# Patient Record
Sex: Female | Born: 1935 | Race: White | Hispanic: No | Smoking: Never smoker
Health system: Southern US, Community
[De-identification: ages and names within clinical notes are randomized; demographics above are authoritative.]

## PROBLEM LIST (undated history)

## (undated) DIAGNOSIS — M4800 Spinal stenosis, site unspecified: Secondary | ICD-10-CM

## (undated) DIAGNOSIS — J45909 Unspecified asthma, uncomplicated: Secondary | ICD-10-CM

## (undated) DIAGNOSIS — I10 Essential (primary) hypertension: Secondary | ICD-10-CM

## (undated) HISTORY — PX: PARTIAL HYSTERECTOMY: SHX80

## (undated) HISTORY — PX: BACK SURGERY: SHX140

## (undated) HISTORY — DX: Unspecified asthma, uncomplicated: J45.909

---

## 1998-06-05 ENCOUNTER — Other Ambulatory Visit: Admission: RE | Admit: 1998-06-05 | Discharge: 1998-06-05 | Payer: Self-pay | Admitting: Family Medicine

## 2002-06-15 ENCOUNTER — Ambulatory Visit (HOSPITAL_COMMUNITY): Admission: RE | Admit: 2002-06-15 | Discharge: 2002-06-15 | Payer: Self-pay | Admitting: Gastroenterology

## 2003-02-17 ENCOUNTER — Encounter: Payer: Self-pay | Admitting: Neurosurgery

## 2003-02-17 ENCOUNTER — Encounter: Admission: RE | Admit: 2003-02-17 | Discharge: 2003-02-17 | Payer: Self-pay | Admitting: Neurosurgery

## 2003-02-17 ENCOUNTER — Encounter: Payer: Self-pay | Admitting: Radiology

## 2003-03-03 ENCOUNTER — Encounter: Payer: Self-pay | Admitting: Neurosurgery

## 2003-03-03 ENCOUNTER — Encounter: Admission: RE | Admit: 2003-03-03 | Discharge: 2003-03-03 | Payer: Self-pay | Admitting: Neurosurgery

## 2003-03-11 ENCOUNTER — Emergency Department (HOSPITAL_COMMUNITY): Admission: RE | Admit: 2003-03-11 | Discharge: 2003-03-12 | Payer: Self-pay | Admitting: Emergency Medicine

## 2003-03-11 ENCOUNTER — Encounter: Payer: Self-pay | Admitting: Emergency Medicine

## 2004-05-10 ENCOUNTER — Other Ambulatory Visit: Admission: RE | Admit: 2004-05-10 | Discharge: 2004-05-10 | Payer: Self-pay | Admitting: Family Medicine

## 2010-11-05 ENCOUNTER — Emergency Department (HOSPITAL_COMMUNITY): Payer: Medicare Other

## 2010-11-05 ENCOUNTER — Inpatient Hospital Stay (HOSPITAL_COMMUNITY)
Admission: EM | Admit: 2010-11-05 | Discharge: 2010-11-11 | DRG: 418 | Disposition: A | Discharge status: Home / Self Care | Payer: Medicare Other | Attending: Surgery | Admitting: Surgery

## 2010-11-05 DIAGNOSIS — E876 Hypokalemia: Secondary | ICD-10-CM | POA: Diagnosis not present

## 2010-11-05 DIAGNOSIS — K8000 Calculus of gallbladder with acute cholecystitis without obstruction: Principal | ICD-10-CM | POA: Diagnosis present

## 2010-11-05 DIAGNOSIS — G8929 Other chronic pain: Secondary | ICD-10-CM | POA: Diagnosis present

## 2010-11-05 DIAGNOSIS — K7689 Other specified diseases of liver: Secondary | ICD-10-CM | POA: Diagnosis present

## 2010-11-05 DIAGNOSIS — M5137 Other intervertebral disc degeneration, lumbosacral region: Secondary | ICD-10-CM | POA: Diagnosis present

## 2010-11-05 DIAGNOSIS — B961 Klebsiella pneumoniae [K. pneumoniae] as the cause of diseases classified elsewhere: Secondary | ICD-10-CM | POA: Diagnosis present

## 2010-11-05 DIAGNOSIS — M51379 Other intervertebral disc degeneration, lumbosacral region without mention of lumbar back pain or lower extremity pain: Secondary | ICD-10-CM | POA: Diagnosis present

## 2010-11-05 DIAGNOSIS — H409 Unspecified glaucoma: Secondary | ICD-10-CM | POA: Diagnosis present

## 2010-11-05 DIAGNOSIS — I10 Essential (primary) hypertension: Secondary | ICD-10-CM | POA: Diagnosis present

## 2010-11-05 DIAGNOSIS — N390 Urinary tract infection, site not specified: Secondary | ICD-10-CM | POA: Diagnosis present

## 2010-11-05 LAB — URINALYSIS, ROUTINE W REFLEX MICROSCOPIC
Bilirubin Urine: NEGATIVE
Glucose, UA: NEGATIVE mg/dL
Ketones, ur: 15 mg/dL — AB
Protein, ur: NEGATIVE mg/dL

## 2010-11-05 LAB — CBC
HCT: 41.1 % (ref 36.0–46.0)
MCV: 86.9 fL (ref 78.0–100.0)
RBC: 4.73 MIL/uL (ref 3.87–5.11)
WBC: 13.6 10*3/uL — ABNORMAL HIGH (ref 4.0–10.5)

## 2010-11-05 LAB — COMPREHENSIVE METABOLIC PANEL
ALT: 57 U/L — ABNORMAL HIGH (ref 0–35)
Albumin: 2.6 g/dL — ABNORMAL LOW (ref 3.5–5.2)
Alkaline Phosphatase: 150 U/L — ABNORMAL HIGH (ref 39–117)
BUN: 18 mg/dL (ref 6–23)
Chloride: 96 mEq/L (ref 96–112)
Glucose, Bld: 82 mg/dL (ref 70–99)
Potassium: 3.2 mEq/L — ABNORMAL LOW (ref 3.5–5.1)
Sodium: 132 mEq/L — ABNORMAL LOW (ref 135–145)
Total Bilirubin: 2.5 mg/dL — ABNORMAL HIGH (ref 0.3–1.2)

## 2010-11-05 LAB — DIFFERENTIAL
Basophils Absolute: 0 10*3/uL (ref 0.0–0.1)
Eosinophils Absolute: 0 10*3/uL (ref 0.0–0.7)
Eosinophils Relative: 0 % (ref 0–5)
Lymphs Abs: 1.1 10*3/uL (ref 0.7–4.0)
Monocytes Absolute: 1.2 10*3/uL — ABNORMAL HIGH (ref 0.1–1.0)
Neutrophils Relative %: 83 % — ABNORMAL HIGH (ref 43–77)

## 2010-11-05 LAB — URINE MICROSCOPIC-ADD ON

## 2010-11-05 LAB — POCT CARDIAC MARKERS: Myoglobin, poc: 109 ng/mL (ref 12–200)

## 2010-11-05 LAB — LIPASE, BLOOD: Lipase: 23 U/L (ref 11–59)

## 2010-11-05 MED ORDER — IOHEXOL 300 MG/ML  SOLN
80.0000 mL | Freq: Once | INTRAMUSCULAR | Status: AC | PRN
  Administered 2010-11-05: 80 mL via INTRAVENOUS

## 2010-11-06 ENCOUNTER — Inpatient Hospital Stay (HOSPITAL_COMMUNITY): Payer: Medicare Other

## 2010-11-06 LAB — COMPREHENSIVE METABOLIC PANEL
ALT: 40 U/L — ABNORMAL HIGH (ref 0–35)
AST: 35 U/L (ref 0–37)
Alkaline Phosphatase: 148 U/L — ABNORMAL HIGH (ref 39–117)
CO2: 24 mEq/L (ref 19–32)
Calcium: 8.5 mg/dL (ref 8.4–10.5)
Chloride: 104 mEq/L (ref 96–112)
GFR calc Af Amer: >60 mL/min (ref >60)
GFR calc non Af Amer: >60 mL/min (ref >60)
Glucose, Bld: 76 mg/dL (ref 70–99)
Potassium: 3 mEq/L — ABNORMAL LOW (ref 3.5–5.1)
Sodium: 136 mEq/L (ref 135–145)
Total Bilirubin: 2.3 mg/dL — ABNORMAL HIGH (ref 0.3–1.2)

## 2010-11-06 LAB — CBC
HCT: 35.6 % — ABNORMAL LOW (ref 36.0–46.0)
Hemoglobin: 12.3 g/dL (ref 12.0–15.0)
MCHC: 34.6 g/dL (ref 30.0–36.0)
RBC: 4.09 MIL/uL (ref 3.87–5.11)

## 2010-11-06 MED ORDER — GADOBENATE DIMEGLUMINE 529 MG/ML IV SOLN
10.0000 mL | Freq: Once | INTRAVENOUS | Status: AC
  Administered 2010-11-06: 10 mL via INTRAVENOUS

## 2010-11-07 ENCOUNTER — Inpatient Hospital Stay (HOSPITAL_COMMUNITY): Payer: Medicare Other

## 2010-11-07 LAB — COMPREHENSIVE METABOLIC PANEL
ALT: 28 U/L (ref 0–35)
AST: 37 U/L (ref 0–37)
Albumin: 2.3 g/dL — ABNORMAL LOW (ref 3.5–5.2)
CO2: 25 mEq/L (ref 19–32)
Chloride: 104 mEq/L (ref 96–112)
Creatinine, Ser: 0.72 mg/dL (ref 0.4–1.2)
GFR calc Af Amer: >60 mL/min (ref >60)
GFR calc non Af Amer: >60 mL/min (ref >60)
Potassium: 2.9 mEq/L — ABNORMAL LOW (ref 3.5–5.1)
Sodium: 135 mEq/L (ref 135–145)
Total Bilirubin: 2.3 mg/dL — ABNORMAL HIGH (ref 0.3–1.2)

## 2010-11-07 LAB — URINE CULTURE

## 2010-11-07 LAB — CBC
HCT: 33.6 % — ABNORMAL LOW (ref 36.0–46.0)
Hemoglobin: 11.6 g/dL — ABNORMAL LOW (ref 12.0–15.0)
MCH: 30.4 pg (ref 26.0–34.0)
MCHC: 34.5 g/dL (ref 30.0–36.0)
RDW: 13.7 % (ref 11.5–15.5)

## 2010-11-08 LAB — CBC
MCH: 29.8 pg (ref 26.0–34.0)
MCV: 89.5 fL (ref 78.0–100.0)
Platelets: 221 10*3/uL (ref 150–400)
RBC: 4.1 MIL/uL (ref 3.87–5.11)
RDW: 13.7 % (ref 11.5–15.5)
WBC: 9.2 10*3/uL (ref 4.0–10.5)

## 2010-11-08 LAB — COMPREHENSIVE METABOLIC PANEL
AST: 51 U/L — ABNORMAL HIGH (ref 0–37)
Albumin: 2.7 g/dL — ABNORMAL LOW (ref 3.5–5.2)
BUN: 5 mg/dL — ABNORMAL LOW (ref 6–23)
Creatinine, Ser: 0.76 mg/dL (ref 0.4–1.2)
GFR calc Af Amer: >60 mL/min (ref >60)
Total Protein: 6 g/dL (ref 6.0–8.3)

## 2010-11-08 LAB — LIPASE, BLOOD: Lipase: 26 U/L (ref 11–59)

## 2010-11-08 LAB — CLOSTRIDIUM DIFFICILE BY PCR: Toxigenic C. Difficile by PCR: NEGATIVE

## 2010-11-09 ENCOUNTER — Inpatient Hospital Stay (HOSPITAL_COMMUNITY): Payer: Medicare Other

## 2010-11-09 ENCOUNTER — Other Ambulatory Visit: Payer: Self-pay | Admitting: General Surgery

## 2010-11-09 LAB — CBC
HCT: 34.8 % — ABNORMAL LOW (ref 36.0–46.0)
Hemoglobin: 11.4 g/dL — ABNORMAL LOW (ref 12.0–15.0)
RBC: 3.88 MIL/uL (ref 3.87–5.11)
WBC: 8.5 10*3/uL (ref 4.0–10.5)

## 2010-11-09 LAB — COMPREHENSIVE METABOLIC PANEL
ALT: 30 U/L (ref 0–35)
AST: 34 U/L (ref 0–37)
Albumin: 2.3 g/dL — ABNORMAL LOW (ref 3.5–5.2)
Alkaline Phosphatase: 177 U/L — ABNORMAL HIGH (ref 39–117)
Chloride: 108 mEq/L (ref 96–112)
Creatinine, Ser: 0.69 mg/dL (ref 0.4–1.2)
GFR calc Af Amer: >60 mL/min (ref >60)
Potassium: 3.8 mEq/L (ref 3.5–5.1)
Sodium: 136 mEq/L (ref 135–145)
Total Bilirubin: 1.3 mg/dL — ABNORMAL HIGH (ref 0.3–1.2)

## 2010-11-09 LAB — AMYLASE: Amylase: 28 U/L (ref 0–105)

## 2010-11-14 NOTE — H&P (Signed)
Janet Brooks, Janet Brooks                ACCOUNT NO.:  [id number]  MEDICAL RECORD NO.:  [id number]           PATIENT TYPE:  I  LOCATION:  5125                         FACILITY:  [facility]  PHYSICIAN:  Abigail Miyamoto, M.D. DATE OF BIRTH:  1921/05/07  DATE OF ADMISSION:  11/05/2010 DATE OF DISCHARGE:                             HISTORY & PHYSICAL   CHIEF COMPLAINT:  Nausea, vomiting, and abdominal pain.  HISTORY:  This is an 77 year old female, presents with 3-day history of nausea, vomiting, and abdominal pain.  She reports the pain has been in her epigastrium, radiating up to her right upper quadrant through to her back.  She has had nausea and vomiting with this.  The pain has been moderate to severe.  She has had no similar discomfort.  She denies hematemesis.  She also denies fever or chills.  PAST MEDICAL HISTORY:  Spinal stenosis where she has chronic pain.  She also has hypertension.  PAST SURGICAL HISTORY:  Not obtained.  I will review this with the patient.  MEDICATIONS:  Please see the universal medical reconciliation form.  FAMILY HISTORY:  Noncontributory.  SOCIAL HISTORY:  She does not smoke.  Does not drink alcohol.  REVIEW OF SYSTEMS:  GENERAL:  Negative for fever or chills.  PULMONARY: Negative for cough, shortness of breath, difficulty breathing.  CARDIAC: Negative for chest pain or irregular heartbeat.  Positive for hypertension.  ABDOMEN:  Listed as above.  She has had no previous abdominal pain.  She has had normal bowel movements.  No hematemesis. Urinary is negative for dysuria or hematuria.  Rest of review of systems is unremarkable from a eyes, skin, ears, nose, and throat, musculoskeletal, neurologic, psychiatric, endocrine standpoint, except for spinal stenosis and chronic back pain.  ALLERGIES:  No known drug allergies.  PHYSICAL EXAMINATION:  GENERAL:  This is a thin, elderly female, who is comfortable, and actually appears slightly younger than  stated age. VITAL SIGNS:  Temperature 98.7, heart rate 73, blood pressure is 119/67, respiratory rate is 20. EYES:  Anicteric.  Pupils are reactive bilaterally. ENT:  External ears and nose are normal.  Hearing is normal.  Oropharynx is clear. NECK:  Supple.  Trachea is midline.  There is no thyromegaly. LUNGS:  Clear to auscultation bilaterally with normal respiratory effort. CARDIOVASCULAR:  Regular rate and rhythm.  There are no murmurs.  There is no peripheral edema. ABDOMEN:  Soft.  There is very minimal tenderness in the epigastrium and right upper quadrant with no guarding.  Bowel sounds are positive. There are no hernias.  There is no organomegaly.  There are no masses. EXTREMITIES:  Warm, well perfused.  No edema, cyanosis.  Peripheral pulses are intact in all four extremities. SKIN:  Shows mild jaundice.  There are no rashes. NEUROLOGIC:  Shows gross motor and sensory function in all four extremities. PSYCHIATRIC:  Shows normal affect.  She is awake, alert, and oriented. Judgment appears normal as well.  LABORATORY DATA:  The patient has a bilirubin elevated 2.5, alkaline phosphatase 150, AST 48, ALT 57, lipase is normal at 23.  CBC is elevated with a white blood  count of 13.6 with a left shift, hemoglobin is 14.7, platelets are 164.  The patient has a CAT scan of the abdomen and pelvis this shows choledocholithiasis and stones in her gallbladder. The bile duct is dilated to 1 cm.  There are multiple cyst in the liver with the large being 4.9 cm.  There is also a lesion measuring almost 2 cm in the medial segment of left hepatic lobe of uncertain etiology. The gallbladder wall is not thickened.  There is no pericholecystic fluid.  IMPRESSION:  This patient with cholecystitis, choledocholithiasis, liver cysts, and an indeterminate liver mass.  At this point, she was admitted to the surgical service.  I have consulted Dr. Jeani Hawking as Dr. Charna Elizabeth has done a  colonoscopy initially back in 2003.  He will consider ERCP.  I will also order an MRI of her liver to evaluate this liver lesion, eventually the laparoscopic cholecystectomy as well.     Abigail Miyamoto, M.D.     DB/MEDQ  D:  11/05/2010  T:  11/06/2010  Job:  [id number]  Electronically Signed by Abigail Miyamoto M.D. on 11/14/2010 10:59:12 AM

## 2010-11-17 NOTE — Consult Note (Signed)
Janet Brooks, Janet Brooks                ACCOUNT NO.:  [id number]  MEDICAL RECORD NO.:  [id number]           PATIENT TYPE:  I  LOCATION:  5125                         FACILITY:  [facility]  PHYSICIAN:  Jordan Hawks. Elnoria Howard, MD    DATE OF BIRTH:  August 03, 1920  DATE OF CONSULTATION:  11/06/2010 DATE OF DISCHARGE:                                CONSULTATION   REASON FOR CONSULTATION:  Abdominal pain, nausea, and vomiting.  REFERRING PHYSICIAN:  Abigail Miyamoto, MD  HISTORY OF PRESENT ILLNESS:  This is an 77 year old female with past medical history of spinal stenosis and hypertension, who was admitted to the hospital with complaints of nausea, vomiting, and abdominal pain for the past 3 days.  The patient states that her pain started rather acutely and it was located in the epigastrium and as a result of her symptoms which were persistent, she presented to the emergency room for further evaluation and treatment.  No complaints of any jaundice or fevers.  Further workup in the emergency room revealed that she had choledocholithiasis as well as cholelithiasis.  No evidence of cholecystitis.  There is also findings of some cysts within the liver. Her transaminases were noted to be elevated and consistent with an obstructive etiology.  Subsequently, GI consultation was requested for further evaluation and treatment.  PAST MEDICAL HISTORY AND SURGICAL HISTORY:  As stated above.  FAMILY HISTORY:  Noncontributory.  SOCIAL HISTORY:  Negative for alcohol, tobacco, or illicit drug use.  REVIEW OF SYSTEMS:  As stated above in the history present illness, otherwise negative.  ALLERGIES:  No known drug allergies.  MEDICATIONS: 1. Amlodipine 5 mg p.o. daily. 2. Hydrochlorothiazide 25 mg p.o. daily. 3. Timoptic 1 drop per eye q.12 hours. 4. Morphine one 30 mg IV p.r.n. 5. Protonix 40 mg p.o. daily p.r.n. 6. Ambien 5 mg p.o. at bedtime p.r.n.  ALLERGIES:  No known drug allergies.  PHYSICAL  EXAMINATION:  VITAL SIGNS:  Blood pressure is 130/58, heart rate is 59, respirations 18, temperature is 97.5. GENERAL:  The patient is in no acute distress, alert and oriented. HEENT:  Normocephalic, atraumatic.  Extraocular muscles intact. NECK:  Supple.  No lymphadenopathy. LUNGS:  Clear to auscultation bilaterally. CARDIOVASCULAR:  Regular rate and rhythm. ABDOMEN:  Flat, soft, mild tenderness in the epigastrium.  No rebound, rigidity.  Positive bowel sounds. EXTREMITIES:  No clubbing, cyanosis, or edema.  LABORATORY VALUES:  White blood cell count 6.9, hemoglobin 11.6, MCV is 88.0, platelets at 183.  Sodium 135, potassium 3.9, chloride is 104, CO2 25, glucose 86, BUN 10, creatinine 0.7.  Total bilirubin is 2.3, alk phos 140, AST 37, ALT 28, albumin is 2.3.  IMPRESSION: 1. Choledocholithiasis. 2. Cholelithiasis. 3. Hepatic cyst.  At this time, further evaluation and treatment with an ERCP is required. She is in stable condition and no evidence of an ascending cholangitis given the findings of the CT scan does reveal some hepatic cyst of unclear etiology, an MRI scan will be ordered for that issue to further evaluate.  PLAN: 1. At this time is to treat the patient with antibiotics. 2. Schedule for ERCP on  November 07, 2010.     Jordan Hawks Elnoria Howard, MD     PDH/MEDQ  D:  11/07/2010  T:  11/08/2010  Job:  [id number]  cc:   Abigail Miyamoto, M.D.  Electronically Signed by Jeani Hawking MD on 11/17/2010 08:54:10 PM

## 2010-11-18 NOTE — Discharge Summary (Signed)
Janet Brooks, Janet Brooks                ACCOUNT NO.:  [id number]  MEDICAL RECORD NO.:  [id number]           PATIENT TYPE:  I  LOCATION:  5125                         FACILITY:  [facility]  PHYSICIAN:  Adolph Pollack, M.D.DATE OF BIRTH:  [birth year]  DATE OF ADMISSION:  11/05/2010 DATE OF DISCHARGE:  11/11/2010                              DISCHARGE SUMMARY   ADMISSION DIAGNOSES: 1. Cholecystitis, choledocholithiasis, liver cyst and indeterminate     liver mass. 2. Hypertension. 3. Chronic pain secondary to spinal stenosis.  DISCHARGE DIAGNOSES: 1. Acute cholecystitis with multiple gallstones. 2. A 2.3 cm left hepatic lobe liver mass, questionable cyst versus     cholangiocarcinoma. 3. ERCP with 4 stones and sphincterotomy from the common duct, November 07, 2010, Dr. Elnoria Howard. 4. Urinary tract infection greater than 100,000 Klebsiella sensitive     to Cipro. 5. Hypertension. 6. Spinal stenosis. 7. Glaucoma.  PROCEDURES: 1. CT scan of the abdomen November 05, 2010, showing common bile duct     dilatation with multiple filling defects in the common bile     worrisome for gallstone, indeterminate hepatic liver mass. 2. Liver and kidney cyst. 3. 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 4. MRI of the liver showing cholelithiasis, choledocholithiasis,     intrahepatic and extra-ductal dilatation of the largest common duct     calculus was 9 mm.  There is a 2.3 cm heterogeneous lesion in the     medial left hepatic lobe with possible delayed enhancement while     lesion may reflect altered  perfusion.  There was also question of     intrahepatic cholangiocarcinoma.  Followup MRI suggestion in 3     months.  If ERCP was performed, they recommended brushings. 5. ERCP with sphincterotomy, Dr. Elnoria Howard, November 07, 2010. 6. Laparoscopic cholecystectomy with intraoperative cholangiogram,     November 09, 2010.  BRIEF HISTORY:  The patient is an 77 year old female who presents with 3- day  history of nausea, vomiting, abdominal pain.  She reported pain in the epigastric radiating to the right quadrant.  She has had nausea and vomiting with this.  Pain has been moderate to severe with she had no similar discomfort prior.  She denies hemoptysis or chills.  She has a history of spinal stenosis and chronic pain.  She was seen in consultation by Dr. Magnus Ivan.  Her bilirubin was elevated at 2.5, alk phos was 150, SGOT was 48, SGPT was 57.  Lipase was normal.  White count was elevated at 13.6.  The bile duct was dilated to 1 cm.  There are multiple cysts in the liver with a large just being 4.9 cm  with a 2-cm mass in the medial segment of the hepatic lobe.  At that point, Dr. Magnus Ivan admitted the patient with plans for evaluation by Dr. Elnoria Howard, GI. She has previously had an endoscopy or colonoscopy by Dr. Charna Elizabeth. There was a question of urinary tract infection.  UA cultures were obtained.  With further workup and treatment as indicated.  For further history and physical, please see the dictated note.  PROCEDURES:1. ERCP  November 07, 2010, Dr. Elnoria Howard. 2. Laparoscopic cholecystectomy, intraoperative arteriogram, November 09, 2010, Dr. Andrey Campanile.  HOSPITAL COURSE:  The patient was admitted, placed on IV antibiotics. She had some ongoing nausea, abdominal pain and diarrhea.  Urine culture was obtained, which turned out to be positive for Klebsiella, and she was switched to Cipro.  She has been treated with Cipro but going through the record, it looks like this has been rather inconsistent.  Her total bilirubin went to 2.3 the next day.  She was seen by Dr. Elnoria Howard as scheduled for ERCP and this was performed on November 07, 2010, which showed successful removal of stones in common bile duct along with sphincterotomy.  She showed good improvement on November 08, 2010.  We tentatively plan to take her to the OR.  Her bilirubin was down, but she was fairly tired and exhausted having a good deal  of diarrhea.  We checked a C diff on her, continue her medical management, gave her a day of rest.  She was having good deal of epigastric burning.  She had been placed on Protonix.  She has been on PPI prior to admission also most likely for GERD-type symptoms.  She also complained of some nasal congestion and was started on  Saline nasal spray.  Following day, she was feeling much better.  LFTs had improved further and was Dr. Tawana Scale opinion, she could be taken to the OR.  At that time, she underwent laparoscopic cholecystectomy with intraoperative cholangiogram.  There were no stones noted.  Following day November 10, 2010, she was slightly hypotensive.  Her blood pressure had been in the 110s to 140s.  She was 95.  Urine output was 550.  Overall, she was doing well.  We worked on advancing her diet, & mobilizing her.  Today, she is afebrile.  Her wounds look good.  She has had no further diarrhea.  She is having flatus.  She ate a full breakfast including cheesy eggs and pancakes without difficulty.  Overall, she feels much better and is anxious for discharge.  We will plan to discharge her home today.  Reviewing the records it looks like she has missed at least a couple doses of IV Cipro.  I am going to give her 4 more days of oral Cipro to cover her urinary tract infection.  We will give her oxycodone IR for pain control.  She can resume her amlodipine 5 mg daily, hydrochlorothiazide 25 mg daily, Prilosec 20 mg daily, timolol ophthalmic drops and Atenolol/acetaminophen 2 tablets by mouth t.i.d. p.r.n.  I am going to have her return to follow up with Dr. Abbey Chatters.  He discussed her MR findings and her liver findings.  So, he knows her well.  I recommend she follow up with Dr. Elnoria Howard and primary care [facility].  CONDITION ON DISCHARGE:  Improved.     Eber Hong, P.A.   ______________________________ Adolph Pollack, M.D.    WDJ/MEDQ  D:  11/11/2010   T:  11/11/2010  Job:  [id number]  cc:   Jordan Hawks. Elnoria Howard, MD [facility]  Electronically Signed by Sherrie George P.A. on 11/18/2010 12:47:12 PM Electronically Signed by Avel Peace M.D. on 11/18/2010 02:26:58 PM

## 2010-11-19 NOTE — Op Note (Signed)
NAMETENNYSON, KALLEN                ACCOUNT NO.:  [id number]  MEDICAL RECORD NO.:  [id number]           PATIENT TYPE:  I  LOCATION:  5125                         FACILITY:  [facility]  PHYSICIAN:  Mary Sella. Andrey Campanile, MD     DATE OF BIRTH:  11-20-1920  DATE OF PROCEDURE:  11/09/2010 DATE OF DISCHARGE:                              OPERATIVE REPORT   PREOPERATIVE DIAGNOSIS:  Symptomatic cholelithiasis.  POSTOPERATIVE DIAGNOSIS:  Acute cholecystitis.  PROCEDURE:  Laparoscopic cholecystectomy with intraoperative cholangiogram.  SURGEON:  Mary Sella. Andrey Campanile, MD  ASSISTANT SURGEON:  Dr. Magnus Ivan, MD  ANESTHESIA:  General with local consisting 0.25% Marcaine with epi.  SPECIMEN:  Gallbladder which was sent to Pathology.  ESTIMATED BLOOD LOSS:  Minimal.  FINDINGS:  The patient had some thin filmy adhesions between the surface of the liver and the anterior abdominal wall.  She also had what appeared to be a cystic lesion in the peripheral left lobe in segment two and in fact another potential cyst just to the right of the falciform ligament. Cholangiogram demonstrated no filling defects into the duodenum.  She did have some interesting anatomy with respect to her gallbladder and bile duct.  There was a cystic duct which entered the neck of the gallbladder laterally which is only ductal structure entering the gallbladder.  She had a very diminutive cystic artery that went into the node of Calot.  The right hepatic artery actually curved up and then backed down in an arch configuration.  The cystic duct appeared to enter either the common bile duct or came off the right hepatic duct.  However, we were certainly in the cystic duct but this is the only ductal structure going into the gallbladder.  No other structure entered the gallbladder.  INDICATIONS FOR PROCEDURE:  The patient is an 77 year old lady who came into the ER with abdominal pain, nausea and vomiting.  She was found to have evidence  of elevated LFTs as well as choledocholithiasis, cholecystitis and gallstones.  She was placed on antibiotics for urinary tract infection.  She underwent an ERCP with sphincterotomy on April 12 where they removed multiple common bile duct stones.  Her LFTs have continued to normalize.  She was deemed stable for cholecystectomy.  We discussed at length the risks and benefits of surgery including bleeding, infection, injury to surrounding structures, persistent diarrhea, bile leak, need to convert to an open procedure, DVT occurrence, and wound complications.  She elects to proceed with surgery.  DESCRIPTION OF PROCEDURE:  After obtaining informed consent, the patient was brought to the operating room, placed supine on the operating table. General endotracheal anesthesia was established.  A surgical time-out was performed.  Sequential compression devices had been placed.  She received antibiotics prior to skin incision.  A vertical infraumbilical incision was made with a #11 blade, 1 inch in length.  Subcu tissue was spread.  Fascia was grasped and lifted anteriorly and the fascia was entered.  She had a piece of mesh that had been incorporated into her peritoneum and we went through the purse-string suture was placed around the fascial edges consisting of  0 Vicryl in the UR-6 needle.  The Hasson trocar was placed and pneumoperitoneum was established.  The abdomen was insufflated and she was placed in a reverse Trendelenburg and rotated slightly to the left.  Again, as described she had a thin filmy adhesions between the surface of the liver and the intra-abdominal wall. She had what appeared to be a cyst in the lateral left segment as well a segment 4 just to the right falciform and then the lateral aspect of the right lobe segment 8 was somewhat boggy.  The parenchyma appeared normal but it just almost had a bulge appearance to it.  We placed three 5-mm trocars, one in the subxiphoid,  two in the right hypochondrium all under direct visualization after local has been infiltrated.  The gallbladder was grasped.  She had omental attachments to the gallbladder.  These were stripped down with hook electrocautery as well as in a blunt fashion.  The wall appeared a little bit edematous.  We incised the peritoneum both medially and laterally with hook electrocautery.  I identified the node and stripped it.  There was a little bit of bleeding from the node and hook electrocautery was used for hemostasis.  I identified a pulsating structure in the infundibulum and neck area.  It appeared to be the cystic artery.  We continued to mobilize the gallbladder both medially and laterally with hook electrocautery.  I was able to circumferentially dissect out a tubular structure entering the gallbladder.  This was just lateral to what appeared to be the cystic artery.  It appeared to be a quite pronounced vessel.  As we continued to dissect around this area, it appeared quite evident that this was not the cystic artery that was more than likely the right hepatic artery because the artery arched up and backed down with no communications to the gallbladder.  There was no branch coming off it.  The cystic duct we had identified was the only ductal structure entering the gallbladder. This was the only structure entering the gallbladder.  We were able to see liver through the window.  It appeared to enter either the common bile duct or the right hepatic duct.  The cystic duct was very foreshortened.  We continued to gain mobility on the gallbladder and again confirmed that there was no other structure entering the gallbladder.  I placed a clip distally and I then partially transected it and bile pulled out, we placed the [facility] cholangiogram catheter into the abdomen and threaded into the cystic duct, secured it with a clip. The cholangiogram was performed.  Then, we  reestablished pneumoperitoneum.  The cholangiogram was performed as described above. The pneumoperitoneum was reestablished with the clips.  The catheter was removed and the cholangiogram catheter was removed.  Three clips were placed on the downside of the cystic duct, it was then transected with EndoShears.  We then rolled the gallbladder up in the gallbladder fossa. The artery appeared to arch anterior to this ductal structure.  However, there are no other structures entering the gallbladder.  The gallbladder was mobilized and completely with what I thought was the gallbladder fossa.  The camera was placed in the subxiphoid trocar and the endobag was advanced through the umbilical trocar.  The gallbladder was placed within the bag.  The specimen was removed in its entirety from the abdominal cavity.  Pneumoperitoneum was reestablished.  I irrigated the right upper quadrant with 1 L saline.  Hemostasis was achieved.  There was no  evidence of bleeding or bile leak.  The camera and the [facility] trocar was removed.  The previously placed purse-string was tied down. There was a little bit of gap at the umbilical closure.  Therefore, I placed another 0 Vicryl suture in a figure-of-eight configuration. There was no air leak and there was nothing left for in abdominal closure for indeed  laparoscopically.  Pneumoperitoneum was released. All the trocars were removed.  Skin incisions were closed with 4-0 Monocryl in subcuticular fashion, followed by the application of Benzoin, Steri-Strips and sterile bandage.  All needle and sponge counts were correct x2.  There were no complications.  The patient tolerated the procedure well.    Mary Sella. Andrey Campanile, MD    EMW/MEDQ  D:  11/09/2010  T:  11/10/2010  Job:  [id number]  cc:   Jordan Hawks. Elnoria Howard, MD  Electronically Signed by Gaynelle Adu M.D. on 11/19/2010 08:28:53 AM

## 2011-07-17 ENCOUNTER — Emergency Department (HOSPITAL_COMMUNITY): Payer: Medicare Other

## 2011-07-17 ENCOUNTER — Emergency Department (HOSPITAL_COMMUNITY)
Admission: EM | Admit: 2011-07-17 | Discharge: 2011-07-17 | Disposition: A | Discharge status: Home / Self Care | Payer: Medicare Other | Attending: Emergency Medicine | Admitting: Emergency Medicine

## 2011-07-17 ENCOUNTER — Encounter: Payer: Self-pay | Admitting: Family Medicine

## 2011-07-17 DIAGNOSIS — H409 Unspecified glaucoma: Secondary | ICD-10-CM | POA: Insufficient documentation

## 2011-07-17 DIAGNOSIS — J40 Bronchitis, not specified as acute or chronic: Secondary | ICD-10-CM

## 2011-07-17 DIAGNOSIS — I10 Essential (primary) hypertension: Secondary | ICD-10-CM | POA: Insufficient documentation

## 2011-07-17 DIAGNOSIS — J3489 Other specified disorders of nose and nasal sinuses: Secondary | ICD-10-CM | POA: Insufficient documentation

## 2011-07-17 DIAGNOSIS — R059 Cough, unspecified: Secondary | ICD-10-CM | POA: Insufficient documentation

## 2011-07-17 DIAGNOSIS — R0602 Shortness of breath: Secondary | ICD-10-CM | POA: Insufficient documentation

## 2011-07-17 DIAGNOSIS — Z79899 Other long term (current) drug therapy: Secondary | ICD-10-CM | POA: Insufficient documentation

## 2011-07-17 DIAGNOSIS — R05 Cough: Secondary | ICD-10-CM | POA: Insufficient documentation

## 2011-07-17 HISTORY — DX: Essential (primary) hypertension: I10

## 2011-07-17 HISTORY — DX: Spinal stenosis, site unspecified: M48.00

## 2011-07-17 LAB — DIFFERENTIAL
Eosinophils Relative: 2 % (ref 0–5)
Lymphocytes Relative: 20 % (ref 12–46)
Lymphs Abs: 1.6 10*3/uL (ref 0.7–4.0)
Monocytes Relative: 9 % (ref 3–12)

## 2011-07-17 LAB — COMPREHENSIVE METABOLIC PANEL
ALT: 9 U/L (ref 0–35)
Alkaline Phosphatase: 81 U/L (ref 39–117)
BUN: 8 mg/dL (ref 6–23)
CO2: 27 mEq/L (ref 19–32)
Calcium: 9.9 mg/dL (ref 8.4–10.5)
GFR calc Af Amer: >90 mL/min (ref >90)
GFR calc non Af Amer: 78 mL/min — ABNORMAL LOW (ref >90)
Glucose, Bld: 106 mg/dL — ABNORMAL HIGH (ref 70–99)
Sodium: 135 mEq/L (ref 135–145)

## 2011-07-17 LAB — URINALYSIS, ROUTINE W REFLEX MICROSCOPIC
Bilirubin Urine: NEGATIVE
Ketones, ur: 15 mg/dL — AB
Nitrite: NEGATIVE
Specific Gravity, Urine: 1.007 (ref 1.005–1.030)
Urobilinogen, UA: 0.2 mg/dL (ref 0.0–1.0)

## 2011-07-17 LAB — URINE MICROSCOPIC-ADD ON

## 2011-07-17 LAB — CBC
HCT: 41.3 % (ref 36.0–46.0)
Hemoglobin: 14.5 g/dL (ref 12.0–15.0)
MCV: 86.2 fL (ref 78.0–100.0)
Platelets: 171 10*3/uL (ref 150–400)
RBC: 4.79 MIL/uL (ref 3.87–5.11)
WBC: 7.9 10*3/uL (ref 4.0–10.5)

## 2011-07-17 MED ORDER — SODIUM CHLORIDE 0.9 % IV SOLN
Freq: Once | INTRAVENOUS | Status: AC
  Administered 2011-07-17: 16:00:00 via INTRAVENOUS

## 2011-07-17 MED ORDER — AZITHROMYCIN 250 MG PO TABS: ORAL_TABLET | ORAL | Status: AC

## 2011-07-17 NOTE — ED Notes (Addendum)
Patient transported to XR. 

## 2011-07-17 NOTE — ED Notes (Signed)
Pt is A/O x4. Skin warm and dry. Respirations even and unlabored. NAD noted at this time.  Pt reports cough and cold sx x1 week with low-grade fever starting today.

## 2011-07-17 NOTE — ED Notes (Signed)
Per EMS: pt from home lives alone. Reports cold sx x1 week with fever starting today. Productive cough with green mucous starting today. NAD noted.

## 2011-07-17 NOTE — ED Provider Notes (Signed)
History     CSN: [id number]  Arrival date & time 07/17/11  1433   First MD Initiated Contact with Patient 07/17/11 [phone]      Chief Complaint  Patient presents with  . URI    (Consider location/radiation/quality/duration/timing/severity/associated sxs/prior treatment) Patient is a 77 y.o. female presenting with URI. The history is provided by the patient and a relative.  URI   patient here with cough productive of green sputum x2 days. Nose that she had a clear cough for the last week. Some shortness of breath, denies chest pain diaphoresis. No abdominal pain or urinary symptoms. Denies any syncope at this time. No medications taken prior to arrival. Does also complain of having nasal congestion denies any sore throat ear pain. No pain or photophobia. Patient denies any pedal edema or orthopnea  Past Medical History  Diagnosis Date  . Glaucoma   . Spinal stenosis   . Hypertension     History reviewed. No pertinent past surgical history.  History reviewed. No pertinent family history.  History  Substance Use Topics  . Smoking status: Never Smoker   . Smokeless tobacco: Not on file  . Alcohol Use: No    OB History    Grav Para Term Preterm Abortions TAB SAB Ect Mult Living                  Review of Systems  All other systems reviewed and are negative.    Allergies  Codeine  Home Medications   Current Outpatient Rx  Name Route Sig Dispense Refill  . AMLODIPINE BESYLATE 5 MG PO TABS Oral Take 5 mg by mouth daily.      . DORZOLAMIDE HCL 2 % OP SOLN Both Eyes Place 1 drop into both eyes every 12 (twelve) hours.      Marland Kitchen GABAPENTIN 100 MG PO CAPS Oral Take 200-300 mg by mouth 3 (three) times daily. 2 capsules in the morning, 2 capsules at noon, and 2 capsules at bedtime.     Marland Kitchen HYDROCHLOROTHIAZIDE 25 MG PO TABS Oral Take 25 mg by mouth daily.      Marland Kitchen OMEPRAZOLE 20 MG PO CPDR Oral Take 20 mg by mouth daily as needed. For gerd.      Marland Kitchen PROPRANOLOL HCL ER 80 MG PO CP24  Oral Take 80 mg by mouth daily.      Marland Kitchen TIMOLOL HEMIHYDRATE 0.5 % OP SOLN Both Eyes Place 1 drop into both eyes every 12 (twelve) hours.      . TRAMADOL-ACETAMINOPHEN 37.5-325 MG PO TABS Oral Take 1 tablet by mouth every 6 (six) hours as needed. For pain.       BP 164/92  Temp(Src) 99.5 F (37.5 C) (Oral)  Resp 21  SpO2 94%  Physical Exam  Nursing note and vitals reviewed. Constitutional: She is oriented to person, place, and time. She appears well-developed and well-nourished.  Non-toxic appearance. No distress.  HENT:  Head: Normocephalic and atraumatic.  Eyes: Conjunctivae, EOM and lids are normal. Pupils are equal, round, and reactive to light.  Neck: Normal range of motion. Neck supple. No tracheal deviation present. No mass present.  Cardiovascular: Normal rate, regular rhythm and normal heart sounds.  Exam reveals no gallop.   No murmur heard. Pulmonary/Chest: Effort normal. No stridor. No respiratory distress. She has decreased breath sounds. She has no wheezes. She has no rhonchi. She has no rales.  Abdominal: Soft. Normal appearance and bowel sounds are normal. She exhibits no distension. There is no  tenderness. There is no rebound and no CVA tenderness.  Musculoskeletal: Normal range of motion. She exhibits no edema and no tenderness.  Neurological: She is alert and oriented to person, place, and time. She has normal strength. No cranial nerve deficit or sensory deficit. GCS eye subscore is 4. GCS verbal subscore is 5. GCS motor subscore is 6.  Skin: Skin is warm and dry. No abrasion and no rash noted.  Psychiatric: She has a normal mood and affect. Her speech is normal and behavior is normal.    ED Course  Procedures (including critical care time)   Labs Reviewed  CBC  DIFFERENTIAL  COMPREHENSIVE METABOLIC PANEL  URINALYSIS, ROUTINE W REFLEX MICROSCOPIC  URINE CULTURE  LACTIC ACID, PLASMA   No results found.   No diagnosis found.    MDM  The patient  monitored here and no deterioration in her condition. Suspect the patient has bronchitis we'll treat appropriate        Toy Baker, MD 07/17/11 1727

## 2011-07-19 LAB — URINE CULTURE

## 2011-07-20 NOTE — ED Notes (Signed)
+   urine culture. Chart sent to EDP office for review 

## 2011-07-21 NOTE — ED Notes (Signed)
Rx for bactrim DS bid x 3 days . Follow Up with PCP per Fayrene Helper.

## 2011-07-24 NOTE — ED Notes (Signed)
+  Patient f/u with PCP and put on medication.

## 2012-08-23 ENCOUNTER — Other Ambulatory Visit: Payer: Self-pay | Admitting: Anesthesiology

## 2012-08-23 ENCOUNTER — Ambulatory Visit
Admission: RE | Admit: 2012-08-23 | Discharge: 2012-08-23 | Disposition: A | Discharge status: Home / Self Care | Payer: Medicare Other | Source: Ambulatory Visit | Attending: Anesthesiology | Admitting: Anesthesiology

## 2012-08-23 DIAGNOSIS — M549 Dorsalgia, unspecified: Secondary | ICD-10-CM

## 2012-11-21 ENCOUNTER — Ambulatory Visit (HOSPITAL_COMMUNITY)
Admission: RE | Admit: 2012-11-21 | Discharge: 2012-11-21 | Disposition: A | Discharge status: Home / Self Care | Payer: Medicare Other | Source: Ambulatory Visit | Attending: Family Medicine | Admitting: Family Medicine

## 2012-11-21 ENCOUNTER — Ambulatory Visit (INDEPENDENT_AMBULATORY_CARE_PROVIDER_SITE_OTHER): Payer: Medicare Other | Admitting: Family Medicine

## 2012-11-21 VITALS — BP 175/80 | HR 75 | Temp 97.5°F | Resp 18 | Ht 59.0 in | Wt 102.0 lb

## 2012-11-21 DIAGNOSIS — R42 Dizziness and giddiness: Secondary | ICD-10-CM | POA: Insufficient documentation

## 2012-11-21 DIAGNOSIS — H539 Unspecified visual disturbance: Secondary | ICD-10-CM | POA: Insufficient documentation

## 2012-11-21 LAB — COMPREHENSIVE METABOLIC PANEL
ALT: 11 U/L (ref 0–35)
AST: 26 U/L (ref 0–37)
Albumin: 4 g/dL (ref 3.5–5.2)
Alkaline Phosphatase: 77 U/L (ref 39–117)
BUN: 16 mg/dL (ref 6–23)
CO2: 27 mEq/L (ref 19–32)
Calcium: 10.2 mg/dL (ref 8.4–10.5)
Chloride: 98 mEq/L (ref 96–112)
Creat: 0.72 mg/dL (ref 0.50–1.10)
Glucose, Bld: 121 mg/dL — ABNORMAL HIGH (ref 70–99)
Potassium: 4.3 mEq/L (ref 3.5–5.3)
Sodium: 138 mEq/L (ref 135–145)
Total Bilirubin: 0.5 mg/dL (ref 0.3–1.2)
Total Protein: 7.5 g/dL (ref 6.0–8.3)

## 2012-11-21 LAB — POCT CBC
Granulocyte percent: 65.5 %G (ref 37–80)
HCT, POC: 38.5 % (ref 37.7–47.9)
Hemoglobin: 12.2 g/dL (ref 12.2–16.2)
Lymph, poc: 1.9 (ref 0.6–3.4)
MCH, POC: 29.1 pg (ref 27–31.2)
MCHC: 31.7 g/dL — AB (ref 31.8–35.4)
MCV: 92 fL (ref 80–97)
MID (cbc): 0.5 (ref 0–0.9)
MPV: 11.2 fL (ref 0–99.8)
POC Granulocyte: 4.7 (ref 2–6.9)
POC LYMPH PERCENT: 27 %L (ref 10–50)
POC MID %: 7.5 %M (ref 0–12)
Platelet Count, POC: 192 10*3/uL (ref 142–424)
RBC: 4.19 M/uL (ref 4.04–5.48)
RDW, POC: 13.1 %
WBC: 7.2 10*3/uL (ref 4.6–10.2)

## 2012-11-21 LAB — POCT SEDIMENTATION RATE: POCT SED RATE: 20 mm/hr (ref 0–22)

## 2012-11-21 NOTE — Progress Notes (Signed)
79 yo woman who woke this morning with visual change this morning characterized by bilateral red "dots" superimposed over everything, closing one eye then the other.  No prior episode of this.  She has chronic gait instability owing to spinal stenosis, and she feels off balance usually (no different today)  No new pain anywhere, including headache.  Objective: NAD Eyes:  Fundi benign, EOMI, PERLA HEENT: otherwise normal Mental status:  Normal  Motor exam:  Normal upper extremities Results for orders placed in visit on 11/21/12  POCT CBC      Result Value Range   WBC 7.2  4.6 - 10.2 K/uL   Lymph, poc 1.9  0.6 - 3.4   POC LYMPH PERCENT 27.0  10 - 50 %L   MID (cbc) 0.5  0 - 0.9   POC MID % 7.5  0 - 12 %M   POC Granulocyte 4.7  2 - 6.9   Granulocyte percent 65.5  37 - 80 %G   RBC 4.19  4.04 - 5.48 M/uL   Hemoglobin 12.2  12.2 - 16.2 g/dL   HCT, POC 14.7  82.9 - 47.9 %   MCV 92.0  80 - 97 fL   MCH, POC 29.1  27 - 31.2 pg   MCHC 31.7 (*) 31.8 - 35.4 g/dL   RDW, POC 56.2     Platelet Count, POC 192  142 - 424 K/uL   MPV 11.2  0 - 99.8 fL  POCT SEDIMENTATION RATE      Result Value Range   POCT SED RATE 20  0 - 22 mm/hr   No neck bruits Heart:  Regular with II/VI systolic murmur Chest:  Clear Skin:  Clear  Assessment; disturbance of visual cortex.  No obvious focal neurological findings.  Plan: Unspecified visual disturbance - Plan: POCT CBC, POCT SEDIMENTATION RATE, Comprehensive metabolic panel, CT Head Wo Contrast  CT was negative, so we will refer to neurology tomorrow

## 2012-11-22 DIAGNOSIS — I6350 Cerebral infarction due to unspecified occlusion or stenosis of unspecified cerebral artery: Secondary | ICD-10-CM | POA: Insufficient documentation

## 2012-11-23 ENCOUNTER — Other Ambulatory Visit: Payer: Self-pay | Admitting: Family Medicine

## 2012-11-23 ENCOUNTER — Ambulatory Visit
Admission: RE | Admit: 2012-11-23 | Discharge: 2012-11-23 | Disposition: A | Discharge status: Home / Self Care | Payer: Medicare Other | Source: Ambulatory Visit | Attending: Family Medicine | Admitting: Family Medicine

## 2012-11-23 DIAGNOSIS — H539 Unspecified visual disturbance: Secondary | ICD-10-CM

## 2012-11-24 ENCOUNTER — Encounter: Payer: Self-pay | Admitting: Neurology

## 2012-11-24 ENCOUNTER — Ambulatory Visit (INDEPENDENT_AMBULATORY_CARE_PROVIDER_SITE_OTHER): Payer: Medicare Other | Admitting: Neurology

## 2012-11-24 VITALS — BP 153/84 | HR 75 | Ht <= 58 in | Wt 109.0 lb

## 2012-11-24 DIAGNOSIS — H5310 Unspecified subjective visual disturbances: Secondary | ICD-10-CM

## 2012-11-24 DIAGNOSIS — H539 Unspecified visual disturbance: Secondary | ICD-10-CM

## 2012-11-24 DIAGNOSIS — R42 Dizziness and giddiness: Secondary | ICD-10-CM | POA: Insufficient documentation

## 2012-11-24 DIAGNOSIS — I6350 Cerebral infarction due to unspecified occlusion or stenosis of unspecified cerebral artery: Secondary | ICD-10-CM

## 2012-11-24 MED ORDER — ASPIRIN EC 325 MG PO TBEC: 325.0000 mg | DELAYED_RELEASE_TABLET | Freq: Every day | ORAL | Status: DC

## 2012-11-24 NOTE — Patient Instructions (Addendum)
She was advised to start aspirin 325 mg daily and check outpatient MRA of the neck, brain, 2-D echocardiogram, EKG and fasting lipid profile and an A1c.Check EEG for occipital seizures. Return for followup in 6 weeks with [facility] practitioner

## 2012-11-24 NOTE — Progress Notes (Signed)
[facility] [address] [place]. [place] [address] [phone]       OFFICE CONSULT NOTE  Ms. Janet Brooks Date of Birth:  January 23, 1921 Medical Record Number:  [id number]   Referring MD:  Juluis Rainier, MD  Reason for Referral:  stroke  HPI: 79 year old Caucasian lady who woke up on 11/21/12 with sudden onset of visual disturbances. She states that everything seemed red when she opened her eyes and also noticed some wavy lines as well as geometric designs in front of both eyes. It did not matter if she closed either eye. This lasted all day and even when she was sleeping with eyes closed this was bothering her. She was able to sleep after taking Ambien. This seems to be improving slightly for the last couple of days. She denied any headache, double vision, vertigo, focal extremity weakness numbness or increasing gait or balance problems. She has no prior history of strokes, TIAs or significant neurological problems. She does admit to have been having dizzy episodes for the last 1 month. She describes this as a feeling of swimmy headedness with imbalance and needing to hold onto something to avoid falling. She denies true vertigo. This may last for days. She had prior history of dizzy spells 8 years ago for which she was evaluated by a neurologist in  [facility] and I do not have any of those records but as per the patient all workup was negative. She was even evaluated by ENT physician at that time and no clear explanation was found. She denies any history of ablation but does feel palpitations from time to time. She had CT scan of the head done on 11/21/12 which was unremarkable an MRI scan of the brain done yesterday reviewed personally by me shows a tiny right frontal cortical infarct and a tiny chronic age right cerebellar lacunar infarct. The moderate changes of age-related chronic microvascular disease and mild effusion with cerebral atrophy. She is a independent 79 year old  who lives alone with herself. She has chronic back pain and walks with a wheeled walker and states her balance is not good. She has not had any significant recent falls.  ROS:   14 system review of systems is positive for fatigue, palpitations, most, blurred vision, double vision, eye pain, easy bruising, feeling hot and cold, increased thirst, flushing, joint pain, joint swelling, aching muscle, allergies, skin sensitivity, dizziness, insomnia, anxiety not enough sleep. PMH:  Past Medical History  Diagnosis Date  . Glaucoma(365)   . Spinal stenosis   . Hypertension     Social History:  History   Social History  . Marital Status: Widowed    Spouse Name: N/A    Number of Children: 3  . Years of Education: 12   Occupational History  . Not on file.   Social History Main Topics  . Smoking status: Never Smoker   . Smokeless tobacco: Not on file  . Alcohol Use: No     Comment: 1 cup of coffee a day.  . Drug Use: Not on file  . Sexually Active: Not on file   Other Topics Concern  . Not on file   Social History Narrative  . No narrative on file    Medications:   Current Outpatient Prescriptions on File Prior to Visit  Medication Sig Dispense Refill  . amLODipine (NORVASC) 5 MG tablet Take 5 mg by mouth daily.        . dorzolamide (TRUSOPT) 2 % ophthalmic solution Place  1 drop into both eyes every 12 (twelve) hours.        . gabapentin (NEURONTIN) 100 MG capsule Take 200-300 mg by mouth 3 (three) times daily. 2 capsules in the morning, 2 capsules at noon, and 2 capsules at bedtime.       . hydrochlorothiazide (HYDRODIURIL) 25 MG tablet Take 25 mg by mouth daily.        Marland Kitchen omeprazole (PRILOSEC) 20 MG capsule Take 20 mg by mouth daily as needed. For gerd.        . propranolol (INDERAL LA) 80 MG 24 hr capsule Take 80 mg by mouth daily.        . timolol (BETIMOL) 0.5 % ophthalmic solution Place 1 drop into both eyes every 12 (twelve) hours.        . traMADol-acetaminophen  (ULTRACET) 37.5-325 MG per tablet Take 1 tablet by mouth every 6 (six) hours as needed. For pain.        No current facility-administered medications on file prior to visit.    Allergies:   Allergies  Allergen Reactions  . Codeine Itching   Filed Vitals:   11/24/12 1625  BP: 153/84  Pulse: 75    Physical Exam General: frail elderly pleasant petite Caucasian lady, seated, in no evident distress Head: head normocephalic and atraumatic. Orohparynx benign Neck: supple with no carotid or supraclavicular bruits Cardiovascular: irregular rate and rhythm, soft ejection murmur  Musculoskeletal:mild kyphoscloliosis. Skin:  no rash/petichiae Vascular:  Normal pulses all extremities but slightly irregular ? Atrial fibrillation  Neurologic Exam Mental Status: Awake and fully alert. Oriented to place and time. Recent and remote memory intact. Attention span, concentration and fund of knowledge appropriate. Mood and affect appropriate. Diminished recall 2/3. Animal naming 7. Cranial Nerves: Fundoscopic exam reveals sharp disc margins. Pupils equal, briskly reactive to light. Extraocular movements full without nystagmus. Vision acuity slightly diminished bilaterally but she does not have her glasses with her.Visual fields full to confrontation. Hearing diminished on the right side. Facial sensation intact. Face, tongue, palate moves normally and symmetrically.  Motor: Normal bulk and tone. Normal strength in all tested extremity muscles. Sensory.: intact to tough and pinprick and vibratory.  Coordination: Rapid alternating movements normal in all extremities. Finger-to-nose and heel-to-shin performed accurately bilaterally. Gait and Station: Arises from chair without difficulty. Stance is stooped l. Gait demonstrates short steps and mild imbalance . Unsteady on a narrow base and while walking in a straight line  Reflexes: 1+ and symmetric. Toes downgoing.     ASSESSMENT: 79 year old Caucasian  lady with sudden onset bilateral pain less visual hallucinations of unclear etiology possibilities include small bilateral occipital infarcts not visualized on MRI given MRI finding of a silent right frontal infarct. Strong suspicion for paroxysmal atrial fibrillation or vertebrobasilar ischemia given recent dizzy spells.    PLAN: I had a long discussion with the patient and daughter regarding her symptoms, my assessment, plan for evaluation, treatment and answered questions. Start aspirin 325 mg daily for stroke prevention and strict control of HT with BP goal below 130/90. Check fasting lipid profile, and we will check transthoracic echo, EKG, MRA of the brain and neck, outpatient prolonged cardiac telemetry for paroxysmal atrial fibrillation.. Also check EEG occipital seizures though less likely. Return for followup in 6 weeks with Larita Fife, nurse practitioner or call earlier if necessary.

## 2012-12-02 ENCOUNTER — Ambulatory Visit
Admission: RE | Admit: 2012-12-02 | Discharge: 2012-12-02 | Disposition: A | Discharge status: Home / Self Care | Payer: Medicare Other | Source: Ambulatory Visit | Attending: Neurology | Admitting: Neurology

## 2012-12-02 ENCOUNTER — Other Ambulatory Visit: Payer: Self-pay | Admitting: Neurology

## 2012-12-02 ENCOUNTER — Encounter: Payer: Self-pay | Admitting: Neurology

## 2012-12-02 DIAGNOSIS — I6350 Cerebral infarction due to unspecified occlusion or stenosis of unspecified cerebral artery: Secondary | ICD-10-CM

## 2012-12-02 DIAGNOSIS — H5310 Unspecified subjective visual disturbances: Secondary | ICD-10-CM

## 2012-12-02 MED ORDER — GADOBENATE DIMEGLUMINE 529 MG/ML IV SOLN
20.0000 mL | Freq: Once | INTRAVENOUS | Status: AC | PRN
  Administered 2012-12-02: 20 mL via INTRAVENOUS

## 2012-12-02 NOTE — Telephone Encounter (Signed)
This encounter was created in error - please disregard.

## 2012-12-02 NOTE — Progress Notes (Signed)
Orders for 2dEcho, EKG, and prolonged cardiac event monitor faxed manually to [place] [facility] by [facility].

## 2012-12-09 ENCOUNTER — Other Ambulatory Visit (HOSPITAL_COMMUNITY): Payer: Self-pay | Admitting: Radiology

## 2012-12-09 ENCOUNTER — Telehealth: Payer: Self-pay | Admitting: *Deleted

## 2012-12-09 DIAGNOSIS — I639 Cerebral infarction, unspecified: Secondary | ICD-10-CM

## 2012-12-09 NOTE — Telephone Encounter (Signed)
Alona Bene called re: orders for pt.   Questioning whether bubble study ordered.   They do not do this.   I told her that I ordered the 2D echo, EKG and cardiac event monitor for this pt and read Dr. Marlis Edelson note relating to what he wanted.   No bubble study mentioned.   She verbalized understanding.

## 2012-12-10 ENCOUNTER — Ambulatory Visit (INDEPENDENT_AMBULATORY_CARE_PROVIDER_SITE_OTHER): Payer: Medicare Other | Admitting: *Deleted

## 2012-12-10 ENCOUNTER — Encounter (INDEPENDENT_AMBULATORY_CARE_PROVIDER_SITE_OTHER): Payer: Medicare Other

## 2012-12-10 ENCOUNTER — Other Ambulatory Visit (HOSPITAL_COMMUNITY): Payer: Medicare Other

## 2012-12-10 ENCOUNTER — Ambulatory Visit (HOSPITAL_COMMUNITY): Payer: Medicare Other | Attending: Neurology | Admitting: Radiology

## 2012-12-10 DIAGNOSIS — I080 Rheumatic disorders of both mitral and aortic valves: Secondary | ICD-10-CM | POA: Insufficient documentation

## 2012-12-10 DIAGNOSIS — I079 Rheumatic tricuspid valve disease, unspecified: Secondary | ICD-10-CM | POA: Insufficient documentation

## 2012-12-10 DIAGNOSIS — R42 Dizziness and giddiness: Secondary | ICD-10-CM

## 2012-12-10 DIAGNOSIS — I6789 Other cerebrovascular disease: Secondary | ICD-10-CM

## 2012-12-10 DIAGNOSIS — I10 Essential (primary) hypertension: Secondary | ICD-10-CM | POA: Insufficient documentation

## 2012-12-10 DIAGNOSIS — H5310 Unspecified subjective visual disturbances: Secondary | ICD-10-CM

## 2012-12-10 DIAGNOSIS — I639 Cerebral infarction, unspecified: Secondary | ICD-10-CM

## 2012-12-10 DIAGNOSIS — R002 Palpitations: Secondary | ICD-10-CM

## 2012-12-10 DIAGNOSIS — Z8673 Personal history of transient ischemic attack (TIA), and cerebral infarction without residual deficits: Secondary | ICD-10-CM | POA: Insufficient documentation

## 2012-12-10 NOTE — Progress Notes (Signed)
Patient ID: Janet Brooks, female   DOB: 1920/09/04, 79 y.o.   MRN: [id number] Placed montior on patient by shelley

## 2012-12-10 NOTE — Progress Notes (Signed)
Patient ID: Janet Brooks, female   DOB: 03/07/1921, 79 y.o.   MRN: [id number] error 

## 2012-12-10 NOTE — Progress Notes (Signed)
Patient ID: Janet Brooks, female   DOB: [birth year], 79 y.o.   MRN: [id number] error

## 2012-12-10 NOTE — Progress Notes (Signed)
Echocardiogram performed.  

## 2012-12-10 NOTE — Progress Notes (Signed)
[facility] request for EKG Mylo Red RN

## 2012-12-13 ENCOUNTER — Encounter (HOSPITAL_COMMUNITY): Payer: Self-pay | Admitting: Ophthalmology

## 2012-12-16 ENCOUNTER — Telehealth: Payer: Self-pay | Admitting: Neurology

## 2012-12-17 NOTE — Telephone Encounter (Signed)
Keep f/u scheduled appointment and present treatment

## 2012-12-21 NOTE — Telephone Encounter (Signed)
I called pt and gave her results of MRA/neck and brain , spoke about appt in 02-02-13 to go over all results.   She had cardiac event monitor which she wore for 19 hours, had rash and had to take off, she has sent it back.  She had TTE done.  She has not had EEG and labs (lipid panel and HgbA1c) done.   Pt to be called for this EEG to schedule and pt to have labs when here for this.

## 2012-12-27 ENCOUNTER — Telehealth: Payer: Self-pay | Admitting: Neurology

## 2012-12-27 NOTE — Telephone Encounter (Signed)
I called and left a message for the patient that per Dr. Marlis Edelson note that she has been placed on asa 325 for stroke prevention and she will f/u with Larita Fife on 6 weeks.

## 2012-12-27 NOTE — Telephone Encounter (Signed)
Gave to the scheduler to  make an appointment to have a EEG done. Once the patient comes in for that she will can have her labs done.

## 2012-12-28 ENCOUNTER — Telehealth: Payer: Self-pay | Admitting: Neurology

## 2013-01-05 ENCOUNTER — Ambulatory Visit: Payer: Self-pay | Admitting: Nurse Practitioner

## 2013-01-05 ENCOUNTER — Ambulatory Visit: Payer: Medicare Other | Admitting: Nurse Practitioner

## 2013-02-02 ENCOUNTER — Other Ambulatory Visit: Payer: Self-pay

## 2013-02-02 ENCOUNTER — Ambulatory Visit (INDEPENDENT_AMBULATORY_CARE_PROVIDER_SITE_OTHER): Payer: Medicare Other | Admitting: Nurse Practitioner

## 2013-02-02 ENCOUNTER — Encounter: Payer: Self-pay | Admitting: Nurse Practitioner

## 2013-02-02 ENCOUNTER — Ambulatory Visit: Payer: Self-pay | Admitting: Nurse Practitioner

## 2013-02-02 VITALS — BP 177/77 | HR 53 | Temp 98.0°F | Ht <= 58 in | Wt 104.0 lb

## 2013-02-02 DIAGNOSIS — I6350 Cerebral infarction due to unspecified occlusion or stenosis of unspecified cerebral artery: Secondary | ICD-10-CM

## 2013-02-02 NOTE — Progress Notes (Signed)
[facility]  PATIENT: Janet Brooks DOB: 1920/10/28   HISTORY FROM: patient, daughter REASON FOR VISIT: routine follow up  HISTORY OF PRESENT ILLNESS:  79 year old Caucasian lady who woke up on 11/21/12 with sudden onset of visual disturbances. She states that everything seemed red when she opened her eyes and also noticed some wavy lines as well as geometric designs in front of both eyes. It did not matter if she closed either eye. This lasted all day and even when she was sleeping with eyes closed this was bothering her. She was able to sleep after taking Ambien. This seems to be improving slightly for the last couple of days. She denied any headache, double vision, vertigo, focal extremity weakness numbness or increasing gait or balance problems. She has no prior history of strokes, TIAs or significant neurological problems. She does admit to have been having dizzy episodes for the last 1 month. She describes this as a feeling of swimmy headedness with imbalance and needing to hold onto something to avoid falling. She denies true vertigo. This may last for days. She had prior history of dizzy spells 8 years ago for which she was evaluated by a neurologist in [facility] and I do not have any of those records but as per the patient all workup was negative. She was even evaluated by ENT physician at that time and no clear explanation was found. She denies any history of ablation but does feel palpitations from time to time. She had CT scan of the head done on 11/21/12 which was unremarkable an MRI scan of the brain done yesterday reviewed personally by me shows a tiny right frontal cortical infarct and a tiny chronic age right cerebellar lacunar infarct. The moderate changes of age-related chronic microvascular disease and mild effusion with cerebral atrophy. She is a independent 79 year old who lives alone with herself. She has chronic back pain and walks with a wheeled walker and  states her balance is not good. She has not had any significant recent falls.  UPDATE: 02/02/13:  Janet Brooks returns for follow up since visit on 11/24/12.  She has had no further visual disturbances since her last visit.  MRI showed tiny right frontal cortical infarct and a tiny chronic age right cerebellar lacunar infarct, but nothing to explain the visual disturbances.  She had cardiac event monitor which she wore for 9 hours, could not stand it, had to take off, she has sent it back.  She and daughter state that she would not want loop recorder because she does not want to be on strong blood thinners.  She had TTE done, which was normal.  She has not had EEG done.   Overall she is doing well, but very stressed because son is going through Stage III liver cancer and she is not sleeping well.  She has Rx for Xanax from PCP but does not want to use it.  She reports no falls at home, uses rolling walker.  She is taking Aspirin 325mg  daily and has no bleeding or bruising.   REVIEW OF SYSTEMS: Full 14 system review of systems performed and notable only for: constitutional: N/A  cardiovascular: N/A respiratory: N/A endocrine: N/A  ear/nose/throat: N/A Eyes: blurred vision musculoskeletal: joint pain skin: N/A genitourinary: N/A Gastrointestinal: N/A allergy/immunology: allergies neurological: N/A sleep: N/A psychiatric: N/A   ALLERGIES: Allergies  Allergen Reactions  . Codeine Itching    HOME MEDICATIONS: Outpatient Prescriptions Prior to Visit  Medication Sig Dispense Refill  .  amLODipine (NORVASC) 5 MG tablet Take 5 mg by mouth daily.        Marland Kitchen aspirin EC 325 MG tablet Take 1 tablet (325 mg total) by mouth daily.  30 tablet  0  . dorzolamide (TRUSOPT) 2 % ophthalmic solution Place 1 drop into both eyes every 12 (twelve) hours.        . gabapentin (NEURONTIN) 100 MG capsule Take 200-300 mg by mouth 3 (three) times daily. 2 capsules in the morning, 2 capsules at noon, and 2 capsules at  bedtime.       . hydrochlorothiazide (HYDRODIURIL) 25 MG tablet Take 25 mg by mouth daily.        Marland Kitchen omeprazole (PRILOSEC) 20 MG capsule Take 20 mg by mouth daily as needed. For gerd.        . propranolol (INDERAL LA) 80 MG 24 hr capsule Take 80 mg by mouth daily.        . timolol (BETIMOL) 0.5 % ophthalmic solution Place 1 drop into both eyes every 12 (twelve) hours.        . traMADol-acetaminophen (ULTRACET) 37.5-325 MG per tablet Take 1 tablet by mouth every 6 (six) hours as needed. For pain.       Marland Kitchen zolpidem (AMBIEN) 10 MG tablet Take 10 mg by mouth at bedtime as needed for sleep.       No facility-administered medications prior to visit.    PAST MEDICAL HISTORY: Past Medical History  Diagnosis Date  . Glaucoma   . Spinal stenosis   . Hypertension     PAST SURGICAL HISTORY: Past Surgical History  Procedure Laterality Date  . Partial hysterectomy    . Back surgery      FAMILY HISTORY: Family History  Problem Relation Age of Onset  . Stroke Mother     SOCIAL HISTORY: History   Social History  . Marital Status: Widowed    Spouse Name: N/A    Number of Children: 3  . Years of Education: 12   Occupational History  . Not on file.   Social History Main Topics  . Smoking status: Never Smoker   . Smokeless tobacco: Not on file  . Alcohol Use: No     Comment: 1 cup of coffee a day.  . Drug Use: Not on file  . Sexually Active: Not on file   Other Topics Concern  . Not on file   Social History Narrative  . No narrative on file     PHYSICAL EXAM  Filed Vitals:   02/02/13 1353  BP: 177/77  Pulse: 53  Temp: 98 F (36.7 C)  TempSrc: Oral  Height: 4\' 10"  (1.473 m)  Weight: 104 lb (47.174 kg)   Body mass index is 21.74 kg/(m^2).  Generalized: In no acute distress, pleasant elderly Caucasian female.   Neck: Supple, no carotid bruits   Cardiac: Regular rate rhythm, soft systolic murmur   Pulmonary: Clear to auscultation bilaterally   Musculoskeletal:  moderate kyphoscoliosis   Neurological examination   Mentation: Alert oriented to time, place, history taking, language fluent, and causual conversation  Cranial nerve II-XII: Pupils were equal round reactive to light extraocular movements were full, visual field were full on confrontational test. facial sensation and strength were normal. hearing was intact to finger rubbing bilaterally. Uvula tongue midline. head turning and shoulder shrug and were normal and symmetric.Tongue protrusion into cheek strength was normal. MOTOR: normal bulk and tone, full strength in the BUE, BLE, fine finger movements normal, no  pronator drift SENSORY: normal and symmetric to light touch, pinprick, temperature, vibration COORDINATION: finger-nose-finger normal, there was no truncal ataxia REFLEXES: 1+ and symmetric  GAIT/STATION: Rising up from seated position without assistance, stance is tooped, gait demonstrates short steps and mild imbalance. Unsteady on a narrow base and while walking in a straight line. Ambulates with a rolling walker.  DIAGNOSTIC DATA (LABS, IMAGING, TESTING) - I reviewed patient records, labs, notes, testing and imaging myself where available.  Lab Results  Component Value Date   WBC 7.2 11/21/2012   HGB 12.2 11/21/2012   HCT 38.5 11/21/2012   MCV 92.0 11/21/2012   PLT 171 07/17/2011      Component Value Date/Time   NA 138 11/21/2012 0955   K 4.3 11/21/2012 0955   CL 98 11/21/2012 0955   CO2 27 11/21/2012 0955   GLUCOSE 121* 11/21/2012 0955   BUN 16 11/21/2012 0955   CREATININE 0.72 11/21/2012 0955   CREATININE 0.62 07/17/2011 1535   CALCIUM 10.2 11/21/2012 0955   PROT 7.5 11/21/2012 0955   ALBUMIN 4.0 11/21/2012 0955   AST 26 11/21/2012 0955   ALT 11 11/21/2012 0955   ALKPHOS 77 11/21/2012 0955   BILITOT 0.5 11/21/2012 0955   GFRNONAA 78* 07/17/2011 1535   GFRAA >90 07/17/2011 1535   CT HEAD WITHOUT CONTRAST 11/21/12: Negative noncontrast head CT for age.  MR ANGIOGRAM NECK W Wo  CONTRAST 12/21/12: This MRA of the neck shows no significant stenosis at either carotid bifurcation in the neck. Both vertebral arteries once antegrade flow with focal area of flow reduction at the origin which may be related to artifact which are common in this location versus focal stenosis.  MR MRA HEAD WO CONTRAST:  11/24/12 This MRA of the brain shows no significant stenosis of the large to medium-sized intracranial vessels.  MRI HEAD WITHOUT CONTRAST 11/23/12 Small (very small) subacute appearing cortically based infarct in the right superior frontal gyrus (right MCA territory, pre motor area).   No associated mass effect or hemorrhage. No other acute intracranial abnormality.  2D ECHO 12/10/12:  Left ventricle: The cavity size was normal. Wall thickness was increased in a pattern of mild LVH. Systolic function was normal. The estimated ejection fraction was in the range of 60% to 65%. Doppler parameters are consistent with abnormal left ventricular relaxation (grade 1 diastolic dysfunction). Aortic valve: Mild to moderate regurgitation. Mitral valve: Mild regurgitation.  ASSESSMENT AND PLAN  79 year old Caucasian lady with sudden onset bilateral painless visual hallucinations of unclear etiology possibilities include small bilateral occipital infarcts not visualized on MRI given MRI finding of a silent right frontal infarct. Strong suspicion for paroxysmal atrial fibrillation, could not tolerate [employer] monitor, spoke to patient about loop recorder but she does not want it.  Even if paroxysmal atrial fibrillation was found, she and daughter have decided they do not want her to go on blood thinners stronger than aspirin.  PLAN:  1. Continue aspirin 325 mg orally every day  for secondary stroke prevention and maintain strict control of hypertension with blood pressure goal below 130/90, diabetes with hemoglobin A1c goal below 6.5% and lipids with LDL cholesterol goal below 100 mg/dL. the  patient had significant bleeding or bruising she may switch to aspirin 81 mg daily. 2. Advised patient to see primary care physician about the elevated blood pressure. It has been elevated on last 2 appointments in our office, last was 153/84, and today is 177/77. 3. Followup in 6 months.   Crew Goren NP-C  02/02/2013, 3:17 PM  [facility] [address], [address] [place], [place] [address] [phone]  I have personally examined this patient, reviewed pertinent data, developed plan of care and discussed with patient and agree with above.  Delia Heady, MD

## 2013-02-02 NOTE — Patient Instructions (Signed)
Make visit with Primary MD concerning high blood pressure.  Continue Aspirin 325mg .  Follow up visit in 6 months.  STROKE/TIA INSTRUCTIONS SMOKING Cigarette smoking nearly doubles your risk of having a stroke & is the single most alterable risk factor  If you smoke or have smoked in the last 12 months, you are advised to quit smoking for your health.  Most of the excess cardiovascular risk related to smoking disappears within a year of stopping.  Ask you doctor about anti-smoking medications  [place] Quit Line: 1-800-QUIT NOW  Free Smoking Cessation Classes ([phone]  CHOLESTEROL Know your levels; limit fat & cholesterol in your diet  No results found for this basename: CHOL, HDL, LDLCALC, LDLDIRECT, TRIG, CHOLHDL      Many patients benefit from treatment even if their cholesterol is at goal.  Goal: Total Cholesterol less than 160  Goal:  LDL less than 100  Goal:  HDL greater than 40  Goal:  Triglycerides less than 150  BLOOD PRESSURE American Stroke Association blood pressure target is less that 120/80 mm/Hg  Your discharge blood pressure is:  BP: 177/77 mmHg  Monitor your blood pressure  Limit your salt and alcohol intake  Many individuals will require more than one medication for high blood pressure  DIABETES (A1c is a blood sugar average for last 3 months) Goal A1c is under 7% (A1c is blood sugar average for last 3 months)  Diabetes: No known diagnosis of diabetes    No results found for this basename: HGBA1C    Your A1c can be lowered with medications, healthy diet, and exercise.  Check your blood sugar as directed by your physician  Call your physician if you experience unexplained or low blood sugars.  PHYSICAL ACTIVITY/REHABILITATION Goal is 30 minutes at least 4 days per week    Activity decreases your risk of heart attack and stroke and makes your heart stronger.  It helps control your weight and blood pressure; helps you relax and can improve your  mood.  Participate in a regular exercise program.  Talk with your doctor about the best form of exercise for you (dancing, walking, swimming, cycling).  DIET/WEIGHT Goal is to maintain a healthy weight  Your height is:  Height: 4\' 10"  (147.3 cm) Your current weight is: Weight: 104 lb (47.174 kg) Your body Mass Index (BMI) is:  BMI (Calculated): 21.8  Following the type of diet specifically designed for you will help prevent another stroke.  Your goal Body Mass Index (BMI) is 19-24.  Healthy food habits can help reduce 3 risk factors for stroke:  High cholesterol, hypertension, and excess weight.

## 2014-03-28 ENCOUNTER — Other Ambulatory Visit: Payer: Self-pay | Admitting: Family Medicine

## 2014-03-28 DIAGNOSIS — R160 Hepatomegaly, not elsewhere classified: Secondary | ICD-10-CM

## 2014-03-30 ENCOUNTER — Encounter (HOSPITAL_COMMUNITY): Payer: Self-pay | Admitting: Emergency Medicine

## 2014-03-30 ENCOUNTER — Emergency Department (HOSPITAL_COMMUNITY): Payer: Medicare Other

## 2014-03-30 ENCOUNTER — Observation Stay (HOSPITAL_COMMUNITY)
Admission: EM | Admit: 2014-03-30 | Discharge: 2014-03-31 | Disposition: A | Discharge status: Home / Self Care | Payer: Medicare Other | Attending: Internal Medicine | Admitting: Internal Medicine

## 2014-03-30 DIAGNOSIS — I6350 Cerebral infarction due to unspecified occlusion or stenosis of unspecified cerebral artery: Secondary | ICD-10-CM

## 2014-03-30 DIAGNOSIS — R0789 Other chest pain: Secondary | ICD-10-CM | POA: Diagnosis not present

## 2014-03-30 DIAGNOSIS — R0602 Shortness of breath: Secondary | ICD-10-CM | POA: Diagnosis not present

## 2014-03-30 DIAGNOSIS — R42 Dizziness and giddiness: Secondary | ICD-10-CM

## 2014-03-30 DIAGNOSIS — R079 Chest pain, unspecified: Secondary | ICD-10-CM | POA: Diagnosis present

## 2014-03-30 DIAGNOSIS — I10 Essential (primary) hypertension: Secondary | ICD-10-CM | POA: Diagnosis not present

## 2014-03-30 DIAGNOSIS — H539 Unspecified visual disturbance: Secondary | ICD-10-CM | POA: Insufficient documentation

## 2014-03-30 DIAGNOSIS — G319 Degenerative disease of nervous system, unspecified: Secondary | ICD-10-CM | POA: Insufficient documentation

## 2014-03-30 DIAGNOSIS — H409 Unspecified glaucoma: Secondary | ICD-10-CM | POA: Insufficient documentation

## 2014-03-30 DIAGNOSIS — R61 Generalized hyperhidrosis: Secondary | ICD-10-CM | POA: Insufficient documentation

## 2014-03-30 DIAGNOSIS — M4800 Spinal stenosis, site unspecified: Secondary | ICD-10-CM | POA: Diagnosis not present

## 2014-03-30 DIAGNOSIS — Z7982 Long term (current) use of aspirin: Secondary | ICD-10-CM | POA: Insufficient documentation

## 2014-03-30 DIAGNOSIS — Z79899 Other long term (current) drug therapy: Secondary | ICD-10-CM | POA: Diagnosis not present

## 2014-03-30 LAB — BASIC METABOLIC PANEL
ANION GAP: 16 — AB (ref 5–15)
BUN: 15 mg/dL (ref 6–23)
CO2: 24 meq/L (ref 19–32)
CREATININE: 0.65 mg/dL (ref 0.50–1.10)
Calcium: 9.7 mg/dL (ref 8.4–10.5)
Chloride: 97 mEq/L (ref 96–112)
GFR calc Af Amer: 87 mL/min — ABNORMAL LOW (ref >90)
GFR calc non Af Amer: 75 mL/min — ABNORMAL LOW (ref >90)
Glucose, Bld: 113 mg/dL — ABNORMAL HIGH (ref 70–99)
Potassium: 4.2 mEq/L (ref 3.7–5.3)
Sodium: 137 mEq/L (ref 137–147)

## 2014-03-30 LAB — CBC
HCT: 40.3 % (ref 36.0–46.0)
Hemoglobin: 13.7 g/dL (ref 12.0–15.0)
MCH: 30.1 pg (ref 26.0–34.0)
MCHC: 34 g/dL (ref 30.0–36.0)
MCV: 88.6 fL (ref 78.0–100.0)
PLATELETS: 211 10*3/uL (ref 150–400)
RBC: 4.55 MIL/uL (ref 3.87–5.11)
RDW: 12.8 % (ref 11.5–15.5)
WBC: 6.8 10*3/uL (ref 4.0–10.5)

## 2014-03-30 LAB — CBC WITH DIFFERENTIAL/PLATELET
Basophils Absolute: 0 10*3/uL (ref 0.0–0.1)
Basophils Relative: 1 % (ref 0–1)
Eosinophils Absolute: 0.2 10*3/uL (ref 0.0–0.7)
Eosinophils Relative: 3 % (ref 0–5)
HEMATOCRIT: 42.8 % (ref 36.0–46.0)
Hemoglobin: 14.8 g/dL (ref 12.0–15.0)
LYMPHS PCT: 22 % (ref 12–46)
Lymphs Abs: 1.6 10*3/uL (ref 0.7–4.0)
MCH: 30.8 pg (ref 26.0–34.0)
MCHC: 34.6 g/dL (ref 30.0–36.0)
MCV: 89 fL (ref 78.0–100.0)
MONO ABS: 0.5 10*3/uL (ref 0.1–1.0)
Monocytes Relative: 7 % (ref 3–12)
NEUTROS ABS: 4.9 10*3/uL (ref 1.7–7.7)
Neutrophils Relative %: 67 % (ref 43–77)
Platelets: 227 10*3/uL (ref 150–400)
RBC: 4.81 MIL/uL (ref 3.87–5.11)
RDW: 12.8 % (ref 11.5–15.5)
WBC: 7.2 10*3/uL (ref 4.0–10.5)

## 2014-03-30 LAB — I-STAT TROPONIN, ED: Troponin i, poc: 0.01 ng/mL (ref 0.00–0.08)

## 2014-03-30 LAB — CREATININE, SERUM
Creatinine, Ser: 0.57 mg/dL (ref 0.50–1.10)
GFR calc Af Amer: >90 mL/min (ref >90)
GFR calc non Af Amer: 79 mL/min — ABNORMAL LOW (ref >90)

## 2014-03-30 LAB — TROPONIN I: Troponin I: <0.3 ng/mL (ref <0.30)

## 2014-03-30 MED ORDER — DORZOLAMIDE HCL 2 % OP SOLN
1.0000 [drp] | Freq: Two times a day (BID) | OPHTHALMIC | Status: DC
  Administered 2014-03-30 – 2014-03-31 (×2): 1 [drp] via OPHTHALMIC
  Filled 2014-03-30: qty 10

## 2014-03-30 MED ORDER — ZOLPIDEM TARTRATE 5 MG PO TABS
10.0000 mg | ORAL_TABLET | Freq: Every evening | ORAL | Status: DC | PRN
  Administered 2014-03-30: 10 mg via ORAL
  Filled 2014-03-30: qty 2

## 2014-03-30 MED ORDER — TIMOLOL HEMIHYDRATE 0.5 % OP SOLN
1.0000 [drp] | Freq: Two times a day (BID) | OPHTHALMIC | Status: DC
  Filled 2014-03-30 (×2): qty 10

## 2014-03-30 MED ORDER — ONDANSETRON HCL 4 MG/2ML IJ SOLN: 4.0000 mg | Freq: Four times a day (QID) | INTRAMUSCULAR | Status: DC | PRN

## 2014-03-30 MED ORDER — ACETAMINOPHEN 325 MG PO TABS: 650.0000 mg | ORAL_TABLET | ORAL | Status: DC | PRN

## 2014-03-30 MED ORDER — PROPRANOLOL HCL ER 80 MG PO CP24
80.0000 mg | ORAL_CAPSULE | Freq: Every day | ORAL | Status: DC
  Administered 2014-03-31: 80 mg via ORAL
  Filled 2014-03-30: qty 1

## 2014-03-30 MED ORDER — GABAPENTIN 300 MG PO CAPS
300.0000 mg | ORAL_CAPSULE | Freq: Three times a day (TID) | ORAL | Status: DC
  Administered 2014-03-30 – 2014-03-31 (×3): 300 mg via ORAL
  Filled 2014-03-30 (×6): qty 1

## 2014-03-30 MED ORDER — GI COCKTAIL ~~LOC~~
30.0000 mL | Freq: Four times a day (QID) | ORAL | Status: DC | PRN
  Administered 2014-03-30: 30 mL via ORAL
  Filled 2014-03-30: qty 30

## 2014-03-30 MED ORDER — ALPRAZOLAM 0.25 MG PO TABS
0.2500 mg | ORAL_TABLET | Freq: Every day | ORAL | Status: DC | PRN
  Administered 2014-03-30: 0.25 mg via ORAL
  Filled 2014-03-30: qty 1

## 2014-03-30 MED ORDER — HEPARIN SODIUM (PORCINE) 5000 UNIT/ML IJ SOLN
5000.0000 [IU] | Freq: Three times a day (TID) | INTRAMUSCULAR | Status: DC
  Administered 2014-03-30 – 2014-03-31 (×2): 5000 [IU] via SUBCUTANEOUS
  Filled 2014-03-30 (×3): qty 1

## 2014-03-30 MED ORDER — NITROGLYCERIN 0.4 MG SL SUBL
0.4000 mg | SUBLINGUAL_TABLET | SUBLINGUAL | Status: DC | PRN
  Administered 2014-03-30 (×2): 0.4 mg via SUBLINGUAL
  Filled 2014-03-30: qty 1

## 2014-03-30 MED ORDER — HYDROCHLOROTHIAZIDE 25 MG PO TABS
25.0000 mg | ORAL_TABLET | Freq: Every day | ORAL | Status: DC
  Administered 2014-03-31: 25 mg via ORAL
  Filled 2014-03-30 (×2): qty 1

## 2014-03-30 MED ORDER — ASPIRIN EC 325 MG PO TBEC
325.0000 mg | DELAYED_RELEASE_TABLET | Freq: Every day | ORAL | Status: DC
  Administered 2014-03-31: 325 mg via ORAL
  Filled 2014-03-30 (×2): qty 1

## 2014-03-30 MED ORDER — AMLODIPINE BESYLATE 5 MG PO TABS
5.0000 mg | ORAL_TABLET | Freq: Every day | ORAL | Status: DC
  Administered 2014-03-31: 5 mg via ORAL
  Filled 2014-03-30: qty 1

## 2014-03-30 MED ORDER — MIDAZOLAM HCL 2 MG/2ML IJ SOLN: 1.0000 mg | Freq: Once | INTRAMUSCULAR | Status: AC | PRN

## 2014-03-30 NOTE — ED Provider Notes (Signed)
CSN: [id number]     Arrival date & time 03/30/14  0901 History   First MD Initiated Contact with Patient 03/30/14 [phone]     Chief Complaint  Patient presents with  . Chest Pain     (Consider location/radiation/quality/duration/timing/severity/associated sxs/prior Treatment) Patient is a 80 y.o. female presenting with chest pain. The history is provided by the patient and medical records.  Chest Pain Associated symptoms: shortness of breath    This is a 80 y.o. F with PMH significant for HTN, spinal stenosis, glaucoma, presenting to the ED for chest pain.  Patient states pain started abruptly at 0400 when it woke her from sleep.  States generalized throughout her entire chest, encircling under left breast with radiation into right shoulder and right side of neck, described as an throbbing and burning senstaion.  Endorses some associated SOB.  Daughter states she was mildly diaphoretic at the time.  Patient did take ASA prior to arrival.  Daughter states she did have some mild chest pain on Monday after taking an anti-depressant for the first time, thinks it is because pill got stuck in her throat.  States episode on Monday was nothing like episode today.  Patient states she has minimal pain at this time, but does feel that her heart is pounding.  VS stable on arrival.  Past Medical History  Diagnosis Date  . Glaucoma   . Spinal stenosis   . Hypertension    Past Surgical History  Procedure Laterality Date  . Partial hysterectomy    . Back surgery     Family History  Problem Relation Age of Onset  . Stroke Mother    History  Substance Use Topics  . Smoking status: Never Smoker   . Smokeless tobacco: Not on file  . Alcohol Use: No     Comment: 1 cup of coffee a day.   OB History   Grav Para Term Preterm Abortions TAB SAB Ect Mult Living                 Review of Systems  Respiratory: Positive for shortness of breath.   Cardiovascular: Positive for chest pain.  All other  systems reviewed and are negative.     Allergies  Codeine  Home Medications   Prior to Admission medications   Medication Sig Start Date End Date Taking? Authorizing Provider  ALPRAZolam Prudy Feeler) 1 MG tablet Take 1 mg by mouth at bedtime as needed for sleep.    Historical Provider, MD  amLODipine (NORVASC) 5 MG tablet Take 5 mg by mouth daily.      Historical Provider, MD  aspirin EC 325 MG tablet Take 1 tablet (325 mg total) by mouth daily. 11/24/12   Delia Heady, MD  dorzolamide (TRUSOPT) 2 % ophthalmic solution Place 1 drop into both eyes every 12 (twelve) hours.      Historical Provider, MD  gabapentin (NEURONTIN) 100 MG capsule Take 200-300 mg by mouth 3 (three) times daily. 2 capsules in the morning, 2 capsules at noon, and 2 capsules at bedtime.     Historical Provider, MD  hydrochlorothiazide (HYDRODIURIL) 25 MG tablet Take 25 mg by mouth daily.      Historical Provider, MD  omeprazole (PRILOSEC) 20 MG capsule Take 20 mg by mouth daily as needed. For gerd.      Historical Provider, MD  propranolol (INDERAL LA) 80 MG 24 hr capsule Take 80 mg by mouth daily.      Historical Provider, MD  timolol (BETIMOL) 0.5 %  ophthalmic solution Place 1 drop into both eyes every 12 (twelve) hours.      Historical Provider, MD  traMADol-acetaminophen (ULTRACET) 37.5-325 MG per tablet Take 1 tablet by mouth every 6 (six) hours as needed. For pain.     Historical Provider, MD  zolpidem (AMBIEN) 10 MG tablet Take 10 mg by mouth at bedtime as needed for sleep.    Historical Provider, MD   BP 176/95  Pulse 83  Temp(Src) 97.7 F (36.5 C) (Oral)  Resp 18  SpO2 98%  Physical Exam  Nursing note and vitals reviewed. Constitutional: She is oriented to person, place, and time. She appears well-developed and well-nourished. No distress.  HENT:  Head: Normocephalic and atraumatic.  Mouth/Throat: Oropharynx is clear and moist.  Eyes: Conjunctivae and EOM are normal. Pupils are equal, round, and reactive  to light.  Neck: Normal range of motion. Neck supple.  Cardiovascular: Normal rate, regular rhythm and normal heart sounds.   Pulmonary/Chest: Effort normal and breath sounds normal. No respiratory distress. She has no wheezes.  Abdominal: Soft. Bowel sounds are normal. There is no tenderness. There is no guarding.  Musculoskeletal: Normal range of motion.  Neurological: She is alert and oriented to person, place, and time.  AAOx3, answering questions appropriately; equal strength UE and LE bilaterally; CN grossly intact; moves all extremities appropriately without ataxia; no focal neuro deficits or facial asymmetry appreciated  Skin: Skin is warm and dry. She is not diaphoretic.  Psychiatric: She has a normal mood and affect.    ED Course  Procedures (including critical care time) Labs Review Labs Reviewed  BASIC METABOLIC PANEL - Abnormal; Notable for the following:    Glucose, Bld 113 (*)    GFR calc non Af Amer 75 (*)    GFR calc Af Amer 87 (*)    Anion gap 16 (*)    All other components within normal limits  CBC WITH DIFFERENTIAL  Rosezena Sensor, ED    Imaging Review Dg Chest 2 View  03/30/2014   CLINICAL DATA:  Chest pain.  EXAM: CHEST  2 VIEW  COMPARISON:  10/19/2013, 07/17/2011.  FINDINGS: Mediastinum and hilar structures are normal. Stable pleural parenchymal thickening consistent with scarring. Subtle linear density along the right lung base be a subtle pneumothorax cannot be excluded. Left-side-down decubitus chest x-ray is suggested.Stable cardiomegaly. Pulmonary vascularity is normal. No pleural effusion. Right posterior lateral nondisplaced 6th rib fracture. Diffuse osteopenia and degenerative change.  IMPRESSION: 1. Subtle linear density noted along the right lung base. A subtle right lung base pneumothorax cannot be excluded. Left-side-down decubitus view of the chest suggested. Critical Value/emergent results were called by telephone at the time of interpretation on  03/30/2014 at 10:29 am to Dr. Sharilyn Sites , who verbally acknowledged these results.  2.  Nondisplaced right posterior lateral sixth rib fracture noted.   Electronically Signed   By: Maisie Fus  Register   On: 03/30/2014 10:31   Ct Head Wo Contrast  03/30/2014   CLINICAL DATA:  Chest pain, visual disturbance.  EXAM: CT HEAD WITHOUT CONTRAST  TECHNIQUE: Contiguous axial images were obtained from the base of the skull through the vertex without intravenous contrast.  COMPARISON:  Noncontrast CT scan of the brain of November 21, 2012  FINDINGS: There is mild age appropriate diffuse cerebral atrophy. The ventricles are normal in size and position. There is no intracranial hemorrhage nor intracranial mass effect. There are old subcentimeter lacunar infarctions peripherally in the left basal ganglia and in the mid  body of the right corpus callosum. There is decreased density in the deep white matter of both cerebral hemispheres consistent with chronic small vessel ischemic change. The cerebellum and brainstem are unremarkable.  The observed paranasal sinuses exhibit mild mucoperiosteal thickening in the left frontal region. The mastoid air cells are well pneumatized. There is no acute skull fracture.  IMPRESSION: 1. There is no acute ischemic or hemorrhagic infarction. There is no intracranial mass effect nor hydrocephalus. 2. There are mild changes of chronic small vessel ischemia. Old lacunar infarctions in the basal ganglia are present. 3. There is a small amount of mucoperiosteal thickening in the left frontal sinus which not entirely new.   Electronically Signed   By: David  [place]   On: 03/30/2014 12:37   Dg Chest Left Decubitus  03/30/2014   CLINICAL DATA:  Questionable pneumothorax  EXAM: CHEST - LEFT DECUBITUS  COMPARISON:  PA and lateral chest x-ray of March 30, 2012  FINDINGS: A left-side-down decubitus film was performed to evaluate density and lucency at the right lung base. The right lung is adequately  inflated. No pneumothorax is demonstrated. Minimal atelectasis at the lung base is present. There is no significant pleural effusion on the right or left.  IMPRESSION: There is no evidence of a pneumothorax on the right.   Electronically Signed   By: David  [place]   On: 03/30/2014 11:07     EKG Interpretation   Date/Time:  Thursday March 30 2014 09:19:01 EDT Ventricular Rate:  74 PR Interval:  194 QRS Duration: 76 QT Interval:  405 QTC Calculation: 449 R Axis:   45 Text Interpretation:  Sinus arrhythmia Artifact Consider left ventricular  hypertrophy No significant change since last tracing Confirmed by KNAPP   MD-J, JON ([id number]) on 03/30/2014 9:32:35 AM      MDM   Final diagnoses:  Chest pain, unspecified chest pain type  Visual disturbance   80 y.o. F with generalized chest pain, onset 0400 this morning which awoke her from sleep.  Endorses some SOB, diaphoresis.  Currently only mild chest pain at present.  No prior cardiac hx.  Will start cardiac work-up with EKG, labs, CXR.  Patient given 2 SL NTG with resolution of pain.  EKG NSR, no acute ischemic changes.  On rhythm strip by EMS, patient in and out of AFIB.  On review of medical records, hx of same and declined use of blood thinners so only on ASA at this time.  Followed by [facility] cardiology with stress test and OP cardiac monitor.  Lab work reassuring, trop negative.  CXR with questionable right lower pneumothorax, however decubitus films negative.  While in the emergency department patient began experiencing visual disturbance where she was seeing red dots in her vision. On review of medical records, this has been an ongoing issue for the past several months. She's been evaluated extensively by inpatient and outpatient neurology for this with thoughts that her visual disturbance is not related to her prior strokes. Today's neurlogic exam is non-focal.  Repeat head CT was obtained which is negative for acute findings.  Given  advanced age, patient will be admitted for overnight observation and cardiac r/o.  Case discussed with hospitalist who will admit to telemetry observation. Temp admission orders placed, VS remain stable.  Garlon Hatchet, PA-C 03/30/14 1506

## 2014-03-30 NOTE — ED Notes (Signed)
Patient states chest pain starting at 0400 today, patient describes pain as throbbing/aching, denies n/v, denies diaphoresis

## 2014-03-30 NOTE — ED Notes (Signed)
Attempted to call report

## 2014-03-30 NOTE — ED Provider Notes (Signed)
Patient presented to the urgent complaints of throbbing, aching chest discomfort and bilateral chest. She denies any trouble with nausea vomiting. No diaphoresis. No coughing or shortness of breath.  In the emergency department the patient has also mentioned some intermittent spots and shapes across her field of vision.  Physical Exam  BP 153/92  Pulse 69  Temp(Src) 97.7 F (36.5 C) (Oral)  Resp 18  SpO2 97%  Physical Exam  Nursing note and vitals reviewed. Constitutional: She appears well-developed and well-nourished. No distress.  HENT:  Head: Normocephalic and atraumatic.  Right Ear: External ear normal.  Left Ear: External ear normal.  Eyes: Conjunctivae are normal. Right eye exhibits no discharge. Left eye exhibits no discharge. No scleral icterus.  Neck: Neck supple. No tracheal deviation present.  Cardiovascular: Normal rate, regular rhythm and normal heart sounds.   Pulmonary/Chest: Effort normal. No stridor. No respiratory distress. She has no wheezes.  Abdominal: Soft. Bowel sounds are normal. She exhibits no distension. There is no tenderness. There is no rebound.  Musculoskeletal: She exhibits no edema.  Neurological: She is alert. She has normal strength. She is not disoriented. She displays no tremor. No cranial nerve deficit (no gross deficits) or sensory deficit. She exhibits normal muscle tone. She displays no seizure activity. GCS eye subscore is 4. GCS verbal subscore is 5. GCS motor subscore is 6.  Skin: Skin is warm and dry. No rash noted.  Psychiatric: She has a normal mood and affect.    ED Course  Procedures  EKG Interpretation  Date/Time:  Thursday March 30 2014 09:19:01 EDT Ventricular Rate:  74 PR Interval:  194 QRS Duration: 76 QT Interval:  405 QTC Calculation: 449 R Axis:   45 Text Interpretation:  Sinus arrhythmia Artifact Consider left ventricular hypertrophy No significant change since last tracing Confirmed by Celes Dedic  MD-J, Jalaina Salyers ([id number]) on  03/30/2014 9:32:35 AM       MDM Atypical symptoms however pt does have HTN and advanced age. Will proceed with cardiac eval.  Pt is concerned about her vision symptoms, she states she had a stroke in the past and the symptoms were similar.  Will monitor.  No focal deficits noted at this time.      Linwood Dibbles, MD 03/30/14 [phone]

## 2014-03-30 NOTE — H&P (Signed)
Patient Demographics  Janet Brooks, is a 80 y.o. female  MRN: [id number]   DOB - 15-Dec-1920  Admit Date - 03/30/2014  Outpatient Primary MD for the patient is Mickie Hillier, MD   With History of -  Past Medical History  Diagnosis Date  . Glaucoma   . Spinal stenosis   . Hypertension       Past Surgical History  Procedure Laterality Date  . Partial hysterectomy    . Back surgery      in for   Chief Complaint  Patient presents with  . Chest Pain     HPI  Janet Brooks  is a 80 y.o. female, with significant past medical history of hypertension ,CVA, presents with complaints of chest pain, reports chest pain is midsternal, nonradiating, throbbing/burning quality, started before yesterday at rest, resolved without any intervention, woke her up from sleep at 4 AM today, reports it was accompanied by palpitation, shortness of breath, mild diaphoresis, denies any nausea or vomiting, her troponin was negative, EKG did not have any acute ST or T wave abnormalities, she was given 325 mg of aspirin in ED, currently chest pain free, as well patient reports she has been complaining few days of visual disturbances, reports last year she had similar presentation where she saw Ann Maki in neurology where she was diagnosed with silent CVA, patient reports she's been seeing red spots, and different shapes in her visual fields, and this is the exact presentation last year, she had CT head done in ED which did not show any acute findings.    Review of Systems    In addition to the HPI above,  No Fever-chills, No Headache, complaints of visual disturbance No problems swallowing food or Liquids, No  Cough or Shortness of Breath, complaints of chest pain No Abdominal pain, No Nausea or Vommitting, Bowel movements are regular, No Blood in stool or Urine, No dysuria, No new skin rashes or bruises, No new joints pains-aches,  No new weakness, tingling, numbness in any extremity, No  recent weight gain or loss, No polyuria, polydypsia or polyphagia, No significant Mental Stressors.  A full 10 point Review of Systems was done, except as stated above, all other Review of Systems were negative.   Social History History  Substance Use Topics  . Smoking status: Never Smoker   . Smokeless tobacco: Not on file  . Alcohol Use: No     Comment: 1 cup of coffee a day.     Family History Family History  Problem Relation Age of Onset  . Stroke Mother      Prior to Admission medications   Medication Sig Start Date End Date Taking? Authorizing Provider  ALPRAZolam (XANAX) 0.25 MG tablet Take 0.25-0.5 mg by mouth daily as needed for anxiety.   Yes Historical Provider, MD  amLODipine (NORVASC) 5 MG tablet Take 5 mg by mouth daily.     Yes Historical Provider, MD  aspirin EC 325 MG tablet Take 1 tablet (325 mg total) by mouth daily. 11/24/12  Yes Delia Heady, MD  dorzolamide (TRUSOPT) 2 % ophthalmic solution Place 1 drop into both eyes every 12 (twelve) hours.     Yes Historical Provider, MD  gabapentin (NEURONTIN) 100 MG capsule Take 300 mg by mouth every 8 (eight) hours.    Yes Historical Provider, MD  hydrochlorothiazide (HYDRODIURIL) 25 MG tablet Take 25 mg by mouth daily.     Yes Historical Provider, MD  propranolol (INDERAL LA) 80  MG 24 hr capsule Take 80 mg by mouth daily.     Yes Historical Provider, MD  timolol (BETIMOL) 0.5 % ophthalmic solution Place 1 drop into both eyes every 12 (twelve) hours.     Yes Historical Provider, MD  zolpidem (AMBIEN) 10 MG tablet Take 10 mg by mouth at bedtime as needed for sleep.   Yes Historical Provider, MD  escitalopram (LEXAPRO) 10 MG tablet Take 10 mg by mouth daily.    Historical Provider, MD    Allergies  Allergen Reactions  . Codeine Itching and Rash    Physical Exam  Vitals  Blood pressure 134/51, pulse 38, temperature 97.7 F (36.5 C), temperature source Oral, resp. rate 15, SpO2 98.00%.   1. General frail  elderly female lying in bed in NAD,    2. Normal affect and insight, Not Suicidal or Homicidal, Awake Alert, Oriented X 3.  3. No F.N deficits, ALL C.Nerves Intact, Strength 5/5 all 4 extremities, Sensation intact all 4 extremities, Plantars down going.  4. Ears and Eyes appear Normal, Conjunctivae clear, PERRLA. Moist Oral Mucosa.  5. Supple Neck, No JVD, No cervical lymphadenopathy appriciated, No Carotid Bruits.  6. Symmetrical Chest wall movement, Good air movement bilaterally, CTAB.  7. RRR, No Gallops, Rubs or Murmurs, No Parasternal Heave.  8. Positive Bowel Sounds, Abdomen Soft, No tenderness, No organomegaly appriciated,No rebound -guarding or rigidity.  9.  No Cyanosis, Normal Skin Turgor, No Skin Rash or Bruise.  10. Good muscle tone,  joints appear normal , no effusions, Normal ROM.  11. No Palpable Lymph Nodes in Neck or Axillae    Data Review  CBC  Recent Labs Lab 03/30/14 0953  WBC 7.2  HGB 14.8  HCT 42.8  PLT 227  MCV 89.0  MCH 30.8  MCHC 34.6  RDW 12.8  LYMPHSABS 1.6  MONOABS 0.5  EOSABS 0.2  BASOSABS 0.0   ------------------------------------------------------------------------------------------------------------------  Chemistries   Recent Labs Lab 03/30/14 0953  NA 137  K 4.2  CL 97  CO2 24  GLUCOSE 113*  BUN 15  CREATININE 0.65  CALCIUM 9.7   ------------------------------------------------------------------------------------------------------------------ CrCl is unknown because both a height and weight (above a minimum accepted value) are required for this calculation. ------------------------------------------------------------------------------------------------------------------ No results found for this basename: TSH, T4TOTAL, FREET3, T3FREE, THYROIDAB,  in the last 72 hours   Coagulation profile No results found for this basename: INR, PROTIME,  in the last 168  hours ------------------------------------------------------------------------------------------------------------------- No results found for this basename: DDIMER,  in the last 72 hours -------------------------------------------------------------------------------------------------------------------  Cardiac Enzymes No results found for this basename: CK, CKMB, TROPONINI, MYOGLOBIN,  in the last 168 hours ------------------------------------------------------------------------------------------------------------------ No components found with this basename: POCBNP,    ---------------------------------------------------------------------------------------------------------------  Urinalysis    Component Value Date/Time   COLORURINE YELLOW 07/17/2011 1543   APPEARANCEUR CLEAR 07/17/2011 1543   LABSPEC 1.007 07/17/2011 1543   PHURINE 7.0 07/17/2011 1543   GLUCOSEU NEGATIVE 07/17/2011 1543   HGBUR TRACE* 07/17/2011 1543   BILIRUBINUR NEGATIVE 07/17/2011 1543   KETONESUR 15* 07/17/2011 1543   PROTEINUR 30* 07/17/2011 1543   UROBILINOGEN 0.2 07/17/2011 1543   NITRITE NEGATIVE 07/17/2011 1543   LEUKOCYTESUR SMALL* 07/17/2011 1543    ----------------------------------------------------------------------------------------------------------------  Imaging results:   Dg Chest 2 View  03/30/2014   CLINICAL DATA:  Chest pain.  EXAM: CHEST  2 VIEW  COMPARISON:  10/19/2013, 07/17/2011.  FINDINGS: Mediastinum and hilar structures are normal. Stable pleural parenchymal thickening consistent with scarring. Subtle linear density along the right lung  base be a subtle pneumothorax cannot be excluded. Left-side-down decubitus chest x-ray is suggested.Stable cardiomegaly. Pulmonary vascularity is normal. No pleural effusion. Right posterior lateral nondisplaced 6th rib fracture. Diffuse osteopenia and degenerative change.  IMPRESSION: 1. Subtle linear density noted along the right lung base. A subtle  right lung base pneumothorax cannot be excluded. Left-side-down decubitus view of the chest suggested. Critical Value/emergent results were called by telephone at the time of interpretation on 03/30/2014 at 10:29 am to Dr. Sharilyn Sites , who verbally acknowledged these results.  2.  Nondisplaced right posterior lateral sixth rib fracture noted.   Electronically Signed   By: Maisie Fus  Register   On: 03/30/2014 10:31   Ct Head Wo Contrast  03/30/2014   CLINICAL DATA:  Chest pain, visual disturbance.  EXAM: CT HEAD WITHOUT CONTRAST  TECHNIQUE: Contiguous axial images were obtained from the base of the skull through the vertex without intravenous contrast.  COMPARISON:  Noncontrast CT scan of the brain of November 21, 2012  FINDINGS: There is mild age appropriate diffuse cerebral atrophy. The ventricles are normal in size and position. There is no intracranial hemorrhage nor intracranial mass effect. There are old subcentimeter lacunar infarctions peripherally in the left basal ganglia and in the mid body of the right corpus callosum. There is decreased density in the deep white matter of both cerebral hemispheres consistent with chronic small vessel ischemic change. The cerebellum and brainstem are unremarkable.  The observed paranasal sinuses exhibit mild mucoperiosteal thickening in the left frontal region. The mastoid air cells are well pneumatized. There is no acute skull fracture.  IMPRESSION: 1. There is no acute ischemic or hemorrhagic infarction. There is no intracranial mass effect nor hydrocephalus. 2. There are mild changes of chronic small vessel ischemia. Old lacunar infarctions in the basal ganglia are present. 3. There is a small amount of mucoperiosteal thickening in the left frontal sinus which not entirely new.   Electronically Signed   By: David  [place]   On: 03/30/2014 12:37   Dg Chest Left Decubitus  03/30/2014   CLINICAL DATA:  Questionable pneumothorax  EXAM: CHEST - LEFT DECUBITUS  COMPARISON:  PA  and lateral chest x-ray of March 30, 2012  FINDINGS: A left-side-down decubitus film was performed to evaluate density and lucency at the right lung base. The right lung is adequately inflated. No pneumothorax is demonstrated. Minimal atelectasis at the lung base is present. There is no significant pleural effusion on the right or left.  IMPRESSION: There is no evidence of a pneumothorax on the right.   Electronically Signed   By: David  [place]   On: 03/30/2014 11:07    My personal review of EKG: Rhythm NSR, Rate 74  /min, QTc 449, no Acute ST changes    Assessment & Plan  Principal Problem:   Chest pain Active Problems:   Visual disturbance    1. chest pain:  Patient currently chest pain free, troponin is negative, has no acute EKG changes, but the description of her prescription sounds cardiac, so she'll be admitted for further observation, she is on aspirin, will keep her on sublingual nitroglycerin as needed, will cycle her cardiac enzymes, we'll keep her n.p.o. after midnight in case she will need stress test in the morning, and a stress test will be pending pending her MRI and carotid Doppler finding. As it won't be done if she has any evidence of CVA.  2. Visual disturbances:  As per patient this resembles her most recent CVA, CT  head did not have any acute findings, will order MRI of the brain, and carotid Dopplers, will continue her on aspirin.  3. Hypertension:   Blood pressure is acceptable, continue with hydrochlorothiazide   DVT Prophylaxis Heparin -and SCDs  AM Labs Ordered, also please review Full Orders  Family Communication: Admission, patients condition and plan of care including tests being ordered have been discussed with the patient and daughter who indicate understanding and agree with the plan and Code Status.  Code Status full Likely DC to  home  Condition GUARDED    Time spent in minutes : 55 minutes    Errica Dutil M.D on 03/30/2014 at 2:38  PM  Between 7am to 7pm - Pager - [phone]  After 7pm go to [email or web address] - password [email or web address]  And look for the night coverage person covering me after hours  [facility] Office  [phone]   **Disclaimer: This note may have been dictated with voice recognition software. Similar sounding words can inadvertently be transcribed and this note may contain transcription errors which may not have been corrected upon publication of note.**

## 2014-03-31 ENCOUNTER — Observation Stay (HOSPITAL_COMMUNITY): Payer: Medicare Other

## 2014-03-31 DIAGNOSIS — R079 Chest pain, unspecified: Secondary | ICD-10-CM

## 2014-03-31 MED ORDER — LORAZEPAM 2 MG/ML IJ SOLN
0.5000 mg | Freq: Once | INTRAMUSCULAR | Status: AC
  Administered 2014-03-31: 0.5 mg via INTRAVENOUS

## 2014-03-31 MED ORDER — STERILE WATER FOR INJECTION IJ SOLN
INTRAMUSCULAR | Status: AC
  Filled 2014-03-31: qty 10

## 2014-03-31 MED ORDER — PANTOPRAZOLE SODIUM 40 MG PO TBEC
40.0000 mg | DELAYED_RELEASE_TABLET | Freq: Two times a day (BID) | ORAL | Status: DC
  Administered 2014-03-31: 40 mg via ORAL
  Filled 2014-03-31: qty 1

## 2014-03-31 MED ORDER — LORAZEPAM 2 MG/ML IJ SOLN
INTRAMUSCULAR | Status: AC
  Administered 2014-03-31: 0.5 mg via INTRAVENOUS
  Filled 2014-03-31: qty 1

## 2014-03-31 NOTE — Progress Notes (Signed)
[facility] PROGRESS NOTE  Assessment/Plan: Atypical Chest pain - Start Protonix, burning epigastric pain. - Consulted cardiology cardiac enzyme negative.  Visual disturbance: - MRI and carotid doppler pending    Code Status: full Family Communication: daughter  Disposition Plan: inpatient   Consultants:  Cardiology  Procedures:  MRI 9.4.2015  Carotid doppler 9.4.2015  Antibiotics:  none  HPI/Subjective: No further CP.   Objective: Filed Vitals:   03/30/14 1453 03/30/14 1515 03/30/14 2100 03/31/14 0500  BP: 132/65 157/49 91/42 124/48  Pulse: 57 70 49 52  Temp:  97.7 F (36.5 C) 97.7 F (36.5 C) 98.5 F (36.9 C)  TempSrc:  Oral Oral Oral  Resp: Height:   (1.499 m)    Weight:  46.5 kg (102 lb 8.2 oz)    SpO2: 98% 98% 96% 96%   No intake or output data in the 24 hours ending 03/31/14 0840 Filed Weights   03/30/14 1515  Weight: 46.5 kg (102 lb 8.2 oz)    Exam:  General: Alert, awake, oriented x3, in no acute distress.  HEENT: No bruits, no goiter.  Heart: Regular rate and rhythm. Lungs: Good air movement, clear Abdomen: Soft, nontender, nondistended, positive bowel sounds.    Data Reviewed: Basic Metabolic Panel:  Recent Labs Lab 03/30/14 0953 03/30/14 1630  NA 137  --   K 4.2  --   CL 97  --   CO2 24  --   GLUCOSE 113*  --   BUN 15  --   CREATININE 0.65 0.57  CALCIUM 9.7  --    Liver Function Tests: No results found for this basename: AST, ALT, ALKPHOS, BILITOT, PROT, ALBUMIN,  in the last 168 hours No results found for this basename: LIPASE, AMYLASE,  in the last 168 hours No results found for this basename: AMMONIA,  in the last 168 hours CBC:  Recent Labs Lab 03/30/14 0953 03/30/14 1630  WBC 7.2 6.8  NEUTROABS 4.9  --   HGB 14.8 13.7  HCT 42.8 40.3  MCV 89.0 88.6  PLT 227 211   Cardiac Enzymes:  Recent Labs Lab 03/30/14 1550 03/30/14 2154  TROPONINI <0.30 <0.30   BNP (last 3  results) No results found for this basename: PROBNP,  in the last 8760 hours CBG: No results found for this basename: GLUCAP,  in the last 168 hours  No results found for this or any previous visit (from the past 240 hour(s)).   Studies: Dg Chest 2 View  03/30/2014   CLINICAL DATA:  Chest pain.  EXAM: CHEST  2 VIEW  COMPARISON:  10/19/2013, 07/17/2011.  FINDINGS: Mediastinum and hilar structures are normal. Stable pleural parenchymal thickening consistent with scarring. Subtle linear density along the right lung base be a subtle pneumothorax cannot be excluded. Left-side-down decubitus chest x-ray is suggested.Stable cardiomegaly. Pulmonary vascularity is normal. No pleural effusion. Right posterior lateral nondisplaced 6th rib fracture. Diffuse osteopenia and degenerative change.  IMPRESSION: 1. Subtle linear density noted along the right lung base. A subtle right lung base pneumothorax cannot be excluded. Left-side-down decubitus view of the chest suggested. Critical Value/emergent results were called by telephone at the time of interpretation on 03/30/2014 at 10:29 am to Dr. Sharilyn Sites , who verbally acknowledged these results.  2.  Nondisplaced right posterior lateral sixth rib fracture noted.   Electronically Signed   By: Maisie Fus  Register   On: 03/30/2014 10:31   Ct Head Wo Contrast  03/30/2014   CLINICAL  DATA:  Chest pain, visual disturbance.  EXAM: CT HEAD WITHOUT CONTRAST  TECHNIQUE: Contiguous axial images were obtained from the base of the skull through the vertex without intravenous contrast.  COMPARISON:  Noncontrast CT scan of the brain of November 21, 2012  FINDINGS: There is mild age appropriate diffuse cerebral atrophy. The ventricles are normal in size and position. There is no intracranial hemorrhage nor intracranial mass effect. There are old subcentimeter lacunar infarctions peripherally in the left basal ganglia and in the mid body of the right corpus callosum. There is decreased density  in the deep white matter of both cerebral hemispheres consistent with chronic small vessel ischemic change. The cerebellum and brainstem are unremarkable.  The observed paranasal sinuses exhibit mild mucoperiosteal thickening in the left frontal region. The mastoid air cells are well pneumatized. There is no acute skull fracture.  IMPRESSION: 1. There is no acute ischemic or hemorrhagic infarction. There is no intracranial mass effect nor hydrocephalus. 2. There are mild changes of chronic small vessel ischemia. Old lacunar infarctions in the basal ganglia are present. 3. There is a small amount of mucoperiosteal thickening in the left frontal sinus which not entirely new.   Electronically Signed   By: David  [place]   On: 03/30/2014 12:37   Dg Chest Left Decubitus  03/30/2014   CLINICAL DATA:  Questionable pneumothorax  EXAM: CHEST - LEFT DECUBITUS  COMPARISON:  PA and lateral chest x-ray of March 30, 2012  FINDINGS: A left-side-down decubitus film was performed to evaluate density and lucency at the right lung base. The right lung is adequately inflated. No pneumothorax is demonstrated. Minimal atelectasis at the lung base is present. There is no significant pleural effusion on the right or left.  IMPRESSION: There is no evidence of a pneumothorax on the right.   Electronically Signed   By: David  [place]   On: 03/30/2014 11:07    Scheduled Meds: . amLODipine  5 mg Oral Daily  . aspirin EC  325 mg Oral Daily  . dorzolamide  1 drop Both Eyes Q12H  . gabapentin  300 mg Oral 3 times per day  . heparin  5,000 Units Subcutaneous 3 times per day  . hydrochlorothiazide  25 mg Oral Daily  . pantoprazole  40 mg Oral BID  . propranolol ER  80 mg Oral Daily  . timolol  1 drop Both Eyes Q12H   Continuous Infusions:    Marinda Elk  [facility] Pager [phone]. If 8PM-8AM, please contact night-coverage at [email or web address], password [facility] 03/31/2014, 8:40 AM  LOS: 1 day    **Disclaimer: This  note may have been dictated with voice recognition software. Similar sounding words can inadvertently be transcribed and this note may contain transcription errors which may not have been corrected upon publication of note.**

## 2014-03-31 NOTE — Progress Notes (Signed)
Bilateral carotid artery duplex completed:  1-39% ICA stenosis.  Vertebral artery flow is antegrade.     

## 2014-03-31 NOTE — Discharge Instructions (Signed)
Transient Ischemic Attack  A transient ischemic attack (TIA) is a "warning stroke" that causes stroke-like symptoms. Unlike a stroke, a TIA does not cause permanent damage to the brain. The symptoms of a TIA can happen very fast and do not last long. It is important to know the symptoms of a TIA and what to do. This can help prevent a major stroke or death.  CAUSES   · A TIA is caused by a temporary blockage in an artery in the brain or neck (carotid artery). The blockage does not allow the brain to get the blood supply it needs and can cause different symptoms. The blockage can be caused by either:  ¨ A blood clot.  ¨ Fatty buildup (plaque) in a neck or brain artery.  RISK FACTORS  · High blood pressure (hypertension).  · High cholesterol.  · Diabetes mellitus.  · Heart disease.  · The build up of plaque in the blood vessels (peripheral artery disease or atherosclerosis).  · The build up of plaque in the blood vessels providing blood and oxygen to the brain (carotid artery stenosis).  · An abnormal heart rhythm (atrial fibrillation).  · Obesity.  · Smoking.  · Taking oral contraceptives (especially in combination with smoking).  · Physical inactivity.  · A diet high in fats, salt (sodium), and calories.  · Alcohol use.  · Use of illegal drugs (especially cocaine and methamphetamine).  · Being female.  · Being African American.  · Being over the age of 55.  · Family history of stroke.  · Previous history of blood clots, stroke, TIA, or heart attack.  · Sickle cell disease.  SYMPTOMS   TIA symptoms are the same as a stroke but are temporary. These symptoms usually develop suddenly, or may be newly present upon awakening from sleep:  · Sudden weakness or numbness of the face, arm, or leg, especially on one side of the body.  · Sudden trouble walking or difficulty moving arms or legs.  · Sudden confusion.  · Sudden personality changes.  · Trouble speaking (aphasia) or understanding.  · Difficulty swallowing.  · Sudden  trouble seeing in one or both eyes.  · Double vision.  · Dizziness.  · Loss of balance or coordination.  · Sudden severe headache with no known cause.  · Trouble reading or writing.  · Loss of bowel or bladder control.  · Loss of consciousness.  DIAGNOSIS   Your caregiver may be able to determine the presence or absence of a TIA based on your symptoms, history, and physical exam. Computed tomography (CT scan) of the brain is usually performed to help identify a TIA. Other tests may be done to diagnose a TIA. These tests may include:  · Electrocardiography.  · Continuous heart monitoring.  · Echocardiography.  · Carotid ultrasonography.  · Magnetic resonance imaging (MRI).  · A scan of the brain circulation.  · Blood tests.  PREVENTION   The risk of a TIA can be decreased by appropriately treating high blood pressure, high cholesterol, diabetes, heart disease, and obesity and by quitting smoking, limiting alcohol, and staying physically active.  TREATMENT   Time is of the essence. Since the symptoms of TIA are the same as a stroke, it is important to seek treatment as soon as possible because you may need a medicine to dissolve the clot (thrombolytic) that cannot be given if too much time has passed. Treatment options vary. Treatment options may include rest, oxygen, intravenous (IV) fluids,   and medicines to thin the blood (anticoagulants). Medicines and diet may be used to address diabetes, high blood pressure, and other risk factors. Measures will be taken to prevent short-term and long-term complications, including infection from breathing foreign material into the lungs (aspiration pneumonia), blood clots in the legs, and falls. Treatment options include procedures to either remove plaque in the carotid arteries or dilate carotid arteries that have narrowed due to plaque. Those procedures are:  · Carotid endarterectomy.  · Carotid angioplasty and stenting.  HOME CARE INSTRUCTIONS   · Take all medicines prescribed  by your caregiver. Follow the directions carefully. Medicines may be used to control risk factors for a stroke. Be sure you understand all your medicine instructions.  · You may be told to take aspirin or the anticoagulant warfarin. Warfarin needs to be taken exactly as instructed.  ¨ Taking too much or too little warfarin is dangerous. Too much warfarin increases the risk of bleeding. Too little warfarin continues to allow the risk for blood clots. While taking warfarin, you will need to have regular blood tests to measure your blood clotting time. A PT blood test measures how long it takes for blood to clot. Your PT is used to calculate another value called an INR. Your PT and INR help your caregiver to adjust your dose of warfarin. The dose can change for many reasons. It is critically important that you take warfarin exactly as prescribed.  ¨ Many foods, especially foods high in vitamin K can interfere with warfarin and affect the PT and INR. Foods high in vitamin K include spinach, kale, broccoli, cabbage, collard and turnip greens, brussels sprouts, peas, cauliflower, seaweed, and parsley as well as beef and pork liver, green tea, and soybean oil. You should eat a consistent amount of foods high in vitamin K. Avoid major changes in your diet, or notify your caregiver before changing your diet. Arrange a visit with a dietitian to answer your questions.  ¨ Many medicines can interfere with warfarin and affect the PT and INR. You must tell your caregiver about any and all medicines you take, this includes all vitamins and supplements. Be especially cautious with aspirin and anti-inflammatory medicines. Do not take or discontinue any prescribed or over-the-counter medicine except on the advice of your caregiver or pharmacist.  ¨ Warfarin can have side effects, such as excessive bruising or bleeding. You will need to hold pressure over cuts for longer than usual. Your caregiver or pharmacist will discuss other  potential side effects.  ¨ Avoid sports or activities that may cause injury or bleeding.  ¨ Be mindful when shaving, flossing your teeth, or handling sharp objects.  ¨ Alcohol can change the body's ability to handle warfarin. It is best to avoid alcoholic drinks or consume only very small amounts while taking warfarin. Notify your caregiver if you change your alcohol intake.  ¨ Notify your dentist or other caregivers before procedures.  · Eat a diet that includes 5 or more servings of fruits and vegetables each day. This may reduce the risk of stroke. Certain diets may be prescribed to address high blood pressure, high cholesterol, diabetes, or obesity.  ¨ A low-sodium, low-saturated fat, low-trans fat, low-cholesterol diet is recommended to manage high blood pressure.  ¨ A low-saturated fat, low-trans fat, low-cholesterol, and high-fiber diet may control cholesterol levels.  ¨ A controlled-carbohydrate, controlled-sugar diet is recommended to manage diabetes.  ¨ A reduced-calorie, low-sodium, low-saturated fat, low-trans fat, low-cholesterol diet is recommended to manage obesity.  ·   Maintain a healthy weight.  · Stay physically active. It is recommended that you get at least 30 minutes of activity on most or all days.  · Do not smoke.  · Limit alcohol use even if you are not taking warfarin. Moderate alcohol use is considered to be:  ¨ No more than 2 drinks each day for men.  ¨ No more than 1 drink each day for nonpregnant women.  · Stop drug abuse.  · Home safety. A safe home environment is important to reduce the risk of falls. Your caregiver may arrange for specialists to evaluate your home. Having grab bars in the bedroom and bathroom is often important. Your caregiver may arrange for equipment to be used at home, such as raised toilets and a seat for the shower.  · Follow all instructions for follow-up with your caregiver. This is very important. This includes any referrals and lab tests. Proper follow up can  prevent a stroke or another TIA from occurring.  SEEK MEDICAL CARE IF:  · You have personality changes.  · You have difficulty swallowing.  · You are seeing double.  · You have dizziness.  · You have a fever.  · You have skin breakdown.  SEEK IMMEDIATE MEDICAL CARE IF:   Any of these symptoms may represent a serious problem that is an emergency. Do not wait to see if the symptoms will go away. Get medical help right away. Call your local emergency services (911 in [place] Do not drive yourself to the hospital.  · You have sudden weakness or numbness of the face, arm, or leg, especially on one side of the body.  · You have sudden trouble walking or difficulty moving arms or legs.  · You have sudden confusion.  · You have trouble speaking (aphasia) or understanding.  · You have sudden trouble seeing in one or both eyes.  · You have a loss of balance or coordination.  · You have a sudden, severe headache with no known cause.  · You have new chest pain or an irregular heartbeat.  · You have a partial or total loss of consciousness.  MAKE SURE YOU:   · Understand these instructions.  · Will watch your condition.  · Will get help right away if you are not doing well or get worse.  Document Released: 04/23/2005 Document Revised: 07/19/2013 Document Reviewed: 10/19/2013  [employer]® Patient Information ©2015 [employer]. This information is not intended to replace advice given to you by your health care provider. Make sure you discuss any questions you have with your health care provider.

## 2014-03-31 NOTE — Progress Notes (Signed)
Pts assessment unchanged from this am. D/c instructions reviewed with pt and daughter. Pt in stable condition. D/c'd via wheelchair to private vehicle

## 2014-03-31 NOTE — Progress Notes (Signed)
UR Completed.  Janet Brooks Jane [phone] 03/31/2014  

## 2014-03-31 NOTE — Discharge Summary (Signed)
Physician Discharge Summary  Janet Brooks ZOX:[id number] DOB: March 10, 1921 DOA: 03/30/2014  PCP: Mickie Hillier, MD  Admit date: 03/30/2014 Discharge date: 03/31/2014  Time spent: 35 minutes  Recommendations for Outpatient Follow-up:  1. Follow up with PCP in 4 weeks.  Discharge Diagnoses:  Principal Problem:   Chest pain Active Problems:   Visual disturbance   Discharge Condition: stable  Diet recommendation: heart healthy  Filed Weights   03/30/14 1515  Weight: 46.5 kg (102 lb 8.2 oz)    History of present illness:  80 y.o. female, with significant past medical history of hypertension ,CVA, presents with complaints of chest pain, reports chest pain is midsternal, nonradiating, throbbing/burning quality, started before yesterday at rest, resolved without any intervention, woke her up from sleep at 4 AM today, reports it was accompanied by palpitation, shortness of breath, mild diaphoresis, denies any nausea or vomiting, her troponin was negative, EKG did not have any acute ST or T wave abnormalities, she was given 325 mg of aspirin in ED.  Hospital Course:  Atypical Chest pain  - Start Protonix, burning epigastric pain.  - Consulted cardiology cardiac enzyme negative.  - no further work up.  Visual disturbance:  - MRI no acute findings and carotid doppler <39% ICA stenosis bilaterally.   Procedures:  MRI Brain  Carotid Doppler  Consultations:  cardiology  Discharge Exam: Filed Vitals:   03/31/14 1316  BP: 127/51  Pulse: 62  Temp: 98.3 F (36.8 C)  Resp: 18    General: see progress note  Discharge Instructions You were cared for by a hospitalist during your hospital stay. If you have any questions about your discharge medications or the care you received while you were in the hospital after you are discharged, you can call the unit and asked to speak with the hospitalist on call if the hospitalist that took care of you is not available. Once you are  discharged, your primary care physician will handle any further medical issues. Please note that NO REFILLS for any discharge medications will be authorized once you are discharged, as it is imperative that you return to your primary care physician (or establish a relationship with a primary care physician if you do not have one) for your aftercare needs so that they can reassess your need for medications and monitor your lab values.  Discharge Instructions   Diet - low sodium heart healthy    Complete by:  As directed      Increase activity slowly    Complete by:  As directed           Current Discharge Medication List    CONTINUE these medications which have NOT CHANGED   Details  ALPRAZolam (XANAX) 0.25 MG tablet Take 0.25-0.5 mg by mouth daily as needed for anxiety.    amLODipine (NORVASC) 5 MG tablet Take 5 mg by mouth daily.      aspirin EC 325 MG tablet Take 1 tablet (325 mg total) by mouth daily. Qty: 30 tablet, Refills: 0   Associated Diagnoses: Subjective visual disturbance, unspecified    dorzolamide (TRUSOPT) 2 % ophthalmic solution Place 1 drop into both eyes every 12 (twelve) hours.      gabapentin (NEURONTIN) 100 MG capsule Take 300 mg by mouth every 8 (eight) hours.     hydrochlorothiazide (HYDRODIURIL) 25 MG tablet Take 25 mg by mouth daily.      propranolol (INDERAL LA) 80 MG 24 hr capsule Take 80 mg by mouth daily.  timolol (BETIMOL) 0.5 % ophthalmic solution Place 1 drop into both eyes every 12 (twelve) hours.      zolpidem (AMBIEN) 10 MG tablet Take 10 mg by mouth at bedtime as needed for sleep.    escitalopram (LEXAPRO) 10 MG tablet Take 10 mg by mouth daily.       Allergies  Allergen Reactions  . Codeine Itching and Rash      The results of significant diagnostics from this hospitalization (including imaging, microbiology, ancillary and laboratory) are listed below for reference.    Significant Diagnostic Studies: Dg Chest 2 View  03/30/2014    CLINICAL DATA:  Chest pain.  EXAM: CHEST  2 VIEW  COMPARISON:  10/19/2013, 07/17/2011.  FINDINGS: Mediastinum and hilar structures are normal. Stable pleural parenchymal thickening consistent with scarring. Subtle linear density along the right lung base be a subtle pneumothorax cannot be excluded. Left-side-down decubitus chest x-ray is suggested.Stable cardiomegaly. Pulmonary vascularity is normal. No pleural effusion. Right posterior lateral nondisplaced 6th rib fracture. Diffuse osteopenia and degenerative change.  IMPRESSION: 1. Subtle linear density noted along the right lung base. A subtle right lung base pneumothorax cannot be excluded. Left-side-down decubitus view of the chest suggested. Critical Value/emergent results were called by telephone at the time of interpretation on 03/30/2014 at 10:29 am to Dr. Sharilyn Sites , who verbally acknowledged these results.  2.  Nondisplaced right posterior lateral sixth rib fracture noted.   Electronically Signed   By: Maisie Fus  Register   On: 03/30/2014 10:31   Ct Head Wo Contrast  03/30/2014   CLINICAL DATA:  Chest pain, visual disturbance.  EXAM: CT HEAD WITHOUT CONTRAST  TECHNIQUE: Contiguous axial images were obtained from the base of the skull through the vertex without intravenous contrast.  COMPARISON:  Noncontrast CT scan of the brain of November 21, 2012  FINDINGS: There is mild age appropriate diffuse cerebral atrophy. The ventricles are normal in size and position. There is no intracranial hemorrhage nor intracranial mass effect. There are old subcentimeter lacunar infarctions peripherally in the left basal ganglia and in the mid body of the right corpus callosum. There is decreased density in the deep white matter of both cerebral hemispheres consistent with chronic small vessel ischemic change. The cerebellum and brainstem are unremarkable.  The observed paranasal sinuses exhibit mild mucoperiosteal thickening in the left frontal region. The mastoid air cells  are well pneumatized. There is no acute skull fracture.  IMPRESSION: 1. There is no acute ischemic or hemorrhagic infarction. There is no intracranial mass effect nor hydrocephalus. 2. There are mild changes of chronic small vessel ischemia. Old lacunar infarctions in the basal ganglia are present. 3. There is a small amount of mucoperiosteal thickening in the left frontal sinus which not entirely new.   Electronically Signed   By: David  [place]   On: 03/30/2014 12:37   Mr Brain Wo Contrast  03/31/2014   CLINICAL DATA:  Visual disturbances.  History of CVA.  EXAM: MRI HEAD WITHOUT CONTRAST  TECHNIQUE: Multiplanar, multiecho pulse sequences of the brain and surrounding structures were obtained without intravenous contrast.  COMPARISON:  12/02/2012  FINDINGS: Calvarium and upper cervical spine: No suspicious marrow signal abnormality. Multilevel upper and mid cervical degenerative disc and facet disease, typical for age.  Orbits: Bilateral cataract resection. No acute orbital findings to explain visual disturbance.  Sinuses:  Essentially clear.  Brain: No acute abnormality such as acute infarct, hemorrhage, hydrocephalus, or mass lesion. No evidence of large vessel occlusion.  There is  generalized cerebral volume loss which is typical for age. Mild chronic small-vessel disease with patchy ischemic gliosis throughout the deep cerebral white matter. Stable sub cm small cystic spaces along the right lateral ventricular body, without surrounding gliosis on FLAIR.  IMPRESSION: Negative for acute infarct or other explanation for visual disturbance.   Electronically Signed   By: Tiburcio Pea M.D.   On: 03/31/2014 12:14   Dg Chest Left Decubitus  03/30/2014   CLINICAL DATA:  Questionable pneumothorax  EXAM: CHEST - LEFT DECUBITUS  COMPARISON:  PA and lateral chest x-ray of March 30, 2012  FINDINGS: A left-side-down decubitus film was performed to evaluate density and lucency at the right lung base. The right lung is  adequately inflated. No pneumothorax is demonstrated. Minimal atelectasis at the lung base is present. There is no significant pleural effusion on the right or left.  IMPRESSION: There is no evidence of a pneumothorax on the right.   Electronically Signed   By: David  [place]   On: 03/30/2014 11:07    Microbiology: No results found for this or any previous visit (from the past 240 hour(s)).   Labs: Basic Metabolic Panel:  Recent Labs Lab 03/30/14 0953 03/30/14 1630  NA 137  --   K 4.2  --   CL 97  --   CO2 24  --   GLUCOSE 113*  --   BUN 15  --   CREATININE 0.65 0.57  CALCIUM 9.7  --    Liver Function Tests: No results found for this basename: AST, ALT, ALKPHOS, BILITOT, PROT, ALBUMIN,  in the last 168 hours No results found for this basename: LIPASE, AMYLASE,  in the last 168 hours No results found for this basename: AMMONIA,  in the last 168 hours CBC:  Recent Labs Lab 03/30/14 0953 03/30/14 1630  WBC 7.2 6.8  NEUTROABS 4.9  --   HGB 14.8 13.7  HCT 42.8 40.3  MCV 89.0 88.6  PLT 227 211   Cardiac Enzymes:  Recent Labs Lab 03/30/14 1550 03/30/14 2154  TROPONINI <0.30 <0.30   BNP: BNP (last 3 results) No results found for this basename: PROBNP,  in the last 8760 hours CBG: No results found for this basename: GLUCAP,  in the last 168 hours     Signed:  Marinda Elk  [facility] 03/31/2014, 1:35 PM

## 2014-03-31 NOTE — Consult Note (Signed)
CARDIOLOGY CONSULT NOTE       Patient ID: Janet Brooks MRN: [id number] DOB/AGE: 78-May-1922 78 y.o.  Admit date: 03/30/2014 Referring Physician:  Robb Matar Primary Physician: Mickie Hillier, MD Primary Cardiologist:  New Reason for Consultation:  Chest Pain  Principal Problem:   Chest pain Active Problems:   Visual disturbance   HPI:   80 yo with no previously documented CAD.  Atypical chest pain yesterday  Occurred at rest while sleeping Through out entire chest.  ASA helped  Pain free this am  She ambulates with a walker and still drives Activity like that does not cause pain. No pleuritic component.  No recent trauma.  She has had a recent CVA with visual disturbance She also complained or "red spont" in both eyes being worse.  She is undergoing Neuro w/u with repeat MRA/MRI this am    ROS All other systems reviewed and negative except as noted above  Past Medical History  Diagnosis Date  . Glaucoma   . Spinal stenosis   . Hypertension     Family History  Problem Relation Age of Onset  . Stroke Mother     History   Social History  . Marital Status: Widowed    Spouse Name: N/A    Number of Children: 3  . Years of Education: 12   Occupational History  . Not on file.   Social History Main Topics  . Smoking status: Never Smoker   . Smokeless tobacco: Not on file  . Alcohol Use: No     Comment: 1 cup of coffee a day.  . Drug Use: Not on file  . Sexual Activity: Not on file   Other Topics Concern  . Not on file   Social History Narrative  . No narrative on file    Past Surgical History  Procedure Laterality Date  . Partial hysterectomy    . Back surgery       . amLODipine  5 mg Oral Daily  . aspirin EC  325 mg Oral Daily  . dorzolamide  1 drop Both Eyes Q12H  . gabapentin  300 mg Oral 3 times per day  . heparin  5,000 Units Subcutaneous 3 times per day  . hydrochlorothiazide  25 mg Oral Daily  . propranolol ER  80 mg Oral Daily  . timolol  1  drop Both Eyes Q12H      Physical Exam: Blood pressure 124/48, pulse 52, temperature 98.5 F (36.9 C), temperature source Oral, resp. rate 16, height  (1.499 m), weight 102 lb 8.2 oz (46.5 kg), SpO2 96.00%.   Affect appropriate Frail elderly female  HEENT: normal Neck supple with no adenopathy JVP normal no bruits no thyromegaly Lungs clear with no wheezing and good diaphragmatic motion Heart:  S1/S2 no murmur, no rub, gallop or click PMI normal Abdomen: benighn, BS positve, no tenderness, no AAA no bruit.  No HSM or HJR Distal pulses intact with no bruits No edema Neuro non-focal Skin warm and dry No muscular weakness   Labs:   Lab Results  Component Value Date   WBC 6.8 03/30/2014   HGB 13.7 03/30/2014   HCT 40.3 03/30/2014   MCV 88.6 03/30/2014   PLT 211 03/30/2014    Recent Labs Lab 03/30/14 0953 03/30/14 1630  NA 137  --   K 4.2  --   CL 97  --   CO2 24  --   BUN 15  --   CREATININE 0.65 0.57  CALCIUM 9.7  --   GLUCOSE 113*  --    Lab Results  Component Value Date   TROPONINI <0.30 03/30/2014    No results found for this basename: CHOL   No results found for this basename: HDL   No results found for this basename: LDLCALC   No results found for this basename: TRIG   No results found for this basename: CHOLHDL   No results found for this basename: LDLDIRECT      Radiology: Dg Chest 2 View  03/30/2014   CLINICAL DATA:  Chest pain.  EXAM: CHEST  2 VIEW  COMPARISON:  10/19/2013, 07/17/2011.  FINDINGS: Mediastinum and hilar structures are normal. Stable pleural parenchymal thickening consistent with scarring. Subtle linear density along the right lung base be a subtle pneumothorax cannot be excluded. Left-side-down decubitus chest x-ray is suggested.Stable cardiomegaly. Pulmonary vascularity is normal. No pleural effusion. Right posterior lateral nondisplaced 6th rib fracture. Diffuse osteopenia and degenerative change.  IMPRESSION: 1. Subtle linear density  noted along the right lung base. A subtle right lung base pneumothorax cannot be excluded. Left-side-down decubitus view of the chest suggested. Critical Value/emergent results were called by telephone at the time of interpretation on 03/30/2014 at 10:29 am to Dr. Sharilyn Sites , who verbally acknowledged these results.  2.  Nondisplaced right posterior lateral sixth rib fracture noted.   Electronically Signed   By: Maisie Fus  Register   On: 03/30/2014 10:31   Ct Head Wo Contrast  03/30/2014   CLINICAL DATA:  Chest pain, visual disturbance.  EXAM: CT HEAD WITHOUT CONTRAST  TECHNIQUE: Contiguous axial images were obtained from the base of the skull through the vertex without intravenous contrast.  COMPARISON:  Noncontrast CT scan of the brain of November 21, 2012  FINDINGS: There is mild age appropriate diffuse cerebral atrophy. The ventricles are normal in size and position. There is no intracranial hemorrhage nor intracranial mass effect. There are old subcentimeter lacunar infarctions peripherally in the left basal ganglia and in the mid body of the right corpus callosum. There is decreased density in the deep white matter of both cerebral hemispheres consistent with chronic small vessel ischemic change. The cerebellum and brainstem are unremarkable.  The observed paranasal sinuses exhibit mild mucoperiosteal thickening in the left frontal region. The mastoid air cells are well pneumatized. There is no acute skull fracture.  IMPRESSION: 1. There is no acute ischemic or hemorrhagic infarction. There is no intracranial mass effect nor hydrocephalus. 2. There are mild changes of chronic small vessel ischemia. Old lacunar infarctions in the basal ganglia are present. 3. There is a small amount of mucoperiosteal thickening in the left frontal sinus which not entirely new.   Electronically Signed   By: David  [place]   On: 03/30/2014 12:37   Dg Chest Left Decubitus  03/30/2014   CLINICAL DATA:  Questionable pneumothorax   EXAM: CHEST - LEFT DECUBITUS  COMPARISON:  PA and lateral chest x-ray of March 30, 2012  FINDINGS: A left-side-down decubitus film was performed to evaluate density and lucency at the right lung base. The right lung is adequately inflated. No pneumothorax is demonstrated. Minimal atelectasis at the lung base is present. There is no significant pleural effusion on the right or left.  IMPRESSION: There is no evidence of a pneumothorax on the right.   Electronically Signed   By: David  [place]   On: 03/30/2014 11:07    EKG:  NSR normal   ASSESSMENT AND PLAN:  Chest Pain:  Atypical.  No previous CAD.  Normal ECG.  Enzymes negative Given advanced age and atypical symptoms do not think further cardiac w/u needed CVA:  Previous MRI with 4/14 with right superior frontal gyrus cortical infarct  Not clear to me that this would cause bilateral visual issues.  Repeat MRI/MRA pending HTN:  Continue beta blocker  Ok to d/c once neuro w/u complete  Signed: Charlton Haws 03/31/2014, 8:07 AM

## 2014-04-12 ENCOUNTER — Other Ambulatory Visit: Payer: Self-pay | Admitting: Pediatrics

## 2014-04-25 ENCOUNTER — Other Ambulatory Visit: Payer: Medicare Other

## 2014-06-01 ENCOUNTER — Ambulatory Visit
Admission: RE | Admit: 2014-06-01 | Discharge: 2014-06-01 | Disposition: A | Discharge status: Home / Self Care | Payer: Medicare Other | Source: Ambulatory Visit | Attending: Family Medicine | Admitting: Family Medicine

## 2014-06-01 DIAGNOSIS — R160 Hepatomegaly, not elsewhere classified: Secondary | ICD-10-CM

## 2014-06-01 MED ORDER — IOHEXOL 300 MG/ML  SOLN
100.0000 mL | Freq: Once | INTRAMUSCULAR | Status: AC | PRN
  Administered 2014-06-01: 100 mL via INTRAVENOUS

## 2014-07-27 ENCOUNTER — Telehealth: Payer: Self-pay | Admitting: Neurology

## 2014-07-27 NOTE — Telephone Encounter (Signed)
Pt saw Dr Pearlean BrownieSethi and Heide GuileLynn Lam 2014 needs soon appt for F/U please call dg

## 2014-07-31 NOTE — Telephone Encounter (Signed)
Spoke with patient appointment was scheduled for Tuesday January 19 th at 2:30 pm with Dr Pearlean Brownie.

## 2014-08-15 ENCOUNTER — Ambulatory Visit (INDEPENDENT_AMBULATORY_CARE_PROVIDER_SITE_OTHER): Payer: 59 | Admitting: Neurology

## 2014-08-15 ENCOUNTER — Encounter: Payer: Self-pay | Admitting: Neurology

## 2014-08-15 VITALS — BP 153/74 | HR 46 | Ht 58.75 in

## 2014-08-15 DIAGNOSIS — G43109 Migraine with aura, not intractable, without status migrainosus: Secondary | ICD-10-CM

## 2014-08-15 MED ORDER — DIVALPROEX SODIUM ER 250 MG PO TB24: 500.0000 mg | ORAL_TABLET | Freq: Every day | ORAL | Status: DC

## 2014-08-15 NOTE — Patient Instructions (Signed)
I had a long discussion with the patient and her daughter regarding her episodes of recurrent transient visual disturbances been possibly migraine equivalent. Occipital seizures are less likely. TIAs would be unlikely given recurrent stereotypical nature and absence of any associated symptoms. Recommend trial of Depakote ER 250 mg daily for 2 weeks to be increased to 500 mg daily if tolerated. Check EEG for seizure activity. Return for follow-up in 3 months or call earlier if necessary.

## 2014-08-16 NOTE — Progress Notes (Signed)
[facility]  PATIENT: Janet CravenLucille C Brooks DOB: 1920-11-07   HISTORY FROM: patient, daughter REASON FOR VISIT: routine follow up  HISTORY OF PRESENT ILLNESS:  81 year old Caucasian lady who woke up on 11/21/12 with sudden onset of visual disturbances. She states that everything seemed red when she opened her eyes and also noticed some wavy lines as well as geometric designs in front of both eyes. It did not matter if she closed either eye. This lasted all day and even when she was sleeping with eyes closed this was bothering her. She was able to sleep after taking Ambien. This seems to be improving slightly for the last couple of days. She denied any headache, double vision, vertigo, focal extremity weakness numbness or increasing gait or balance problems. She has no prior history of strokes, TIAs or significant neurological problems. She does admit to have been having dizzy episodes for the last 1 month. She describes this as a feeling of swimmy headedness with imbalance and needing to hold onto something to avoid falling. She denies true vertigo. This may last for days. She had prior history of dizzy spells 8 years ago for which she was evaluated [place] neurolog[place] [facility] [place] and I do not have any of those records but as per the patient all workup was negative. She was even evaluated by ENT physician at that time and no clear explanation was found. She denies any history of ablation but does feel palpitations from time to time. She had CT scan of the head done on 11/21/12 which was unremarkable an MRI scan of the brain done yesterday reviewed personally by me shows a tiny right frontal cortical infarct and a tiny chronic age right cerebellar lacunar infarct. The moderate changes of age-related chronic microvascular disease and mild effusion with cerebral atrophy. She is a independent 81 year old who lives alone with herself. She has chronic back pain and walks with a wheeled walker and  states her balance is not good. She has not had any significant recent falls.  UPDATE: 02/02/13:  [name] returns for follow up since visit on 11/24/12.  She has had no further visual disturbances since her last visit.  MRI showed tiny right frontal cortical infarct and a tiny chronic age right cerebellar lacunar infarct, but nothing to explain the visual disturbances.  She had cardiac event monitor which she wore for 9 hours, could not stand it, had to take off, she has sent it back.  She and daughter state that she would not want loop recorder because she does not want to be on strong blood thinners.  She had TTE done, which was normal.  She has not had EEG done.   Overall she is doing well, but very stressed because son is going through Stage III liver cancer and she is not sleeping well.  She has Rx for Xanax from PCP but does not want to use it.  She reports no falls at home, uses rolling walker.  She is taking Aspirin 325mg  daily and has no bleeding or bruising. Update 08/15/2014 : She is seen today upon request from primary care physician because of recurrent episodes of vision distortion, single red dots as well as [name] metric shapes off and on. This has been occurring once or twice a month. She however has history of these episodes and has been evaluated by me in the past for this. These episodes can last for several days and often triggered by anxiety and stress. She was  seen by to ophthalmologist who did not find any eye abnormalities to explain the symptoms. She denies any prior history of migraines or headaches before or during these symptoms. She had carotid ultrasound done a month and a half ago which showed no significant extracranial stenosis. She was admitted in fact a few months ago to the hospital for anxiety and panic symptoms. She is significantly bothered by severe back pain from spinal stenosis and has walking difficulties and her legs intermittently feel numb.  REVIEW OF SYSTEMS: Full  14 system review of systems performed and notable only for: Back pain, walking difficulty, leg numbness, insomnia, visual distortion, dizziness, headache, depression, anxiety, nervousness, joint pain and swelling and walking difficulty.    ALLERGIES: Allergies  Allergen Reactions  . Codeine Itching and Rash    HOME MEDICATIONS: Outpatient Prescriptions Prior to Visit  Medication Sig Dispense Refill  . ALPRAZolam (XANAX) 0.25 MG tablet Take 0.25-0.5 mg by mouth daily as needed for anxiety.    Marland Kitchen aspirin EC 325 MG tablet Take 1 tablet (325 mg total) by mouth daily. 30 tablet 0  . dorzolamide (TRUSOPT) 2 % ophthalmic solution Place 1 drop into both eyes every 12 (twelve) hours.      Marland Kitchen escitalopram (LEXAPRO) 10 MG tablet Take 10 mg by mouth daily.    Marland Kitchen gabapentin (NEURONTIN) 100 MG capsule Take 300 mg by mouth every 8 (eight) hours.     . hydrochlorothiazide (HYDRODIURIL) 25 MG tablet Take 25 mg by mouth daily.      . propranolol (INDERAL LA) 80 MG 24 hr capsule Take 80 mg by mouth daily.      . timolol (BETIMOL) 0.5 % ophthalmic solution Place 1 drop into both eyes every 12 (twelve) hours.      Marland Kitchen zolpidem (AMBIEN) 10 MG tablet Take 10 mg by mouth at bedtime as needed for sleep.    Marland Kitchen amLODipine (NORVASC) 5 MG tablet Take 5 mg by mouth daily.       No facility-administered medications prior to visit.    PAST MEDICAL HISTORY: Past Medical History  Diagnosis Date  . Glaucoma   . Spinal stenosis   . Hypertension     PAST SURGICAL HISTORY: Past Surgical History  Procedure Laterality Date  . Partial hysterectomy    . Back surgery      FAMILY HISTORY: Family History  Problem Relation Age of Onset  . Stroke Mother     SOCIAL HISTORY: History   Social History  . Marital Status: Widowed    Spouse Name: N/A    Number of Children: 3  . Years of Education: 12   Occupational History  . Not on file.   Social History Main Topics  . Smoking status: Never Smoker   . Smokeless  tobacco: Never Used  . Alcohol Use: No  . Drug Use: No  . Sexual Activity: Not on file   Other Topics Concern  . Not on file   Social History Narrative   Patient is widowed with 3 children.   Patient has hs education.   Patient drinks 1 cup daily.   Patient is right handed.     PHYSICAL EXAM  Filed Vitals:   08/15/14 1526  BP: 153/74  Pulse: 46  Height: 4' 10.75" (1.492 m)   There is no weight on file to calculate BMI.  Generalized: In no acute distress, pleasant  Frail elderly Caucasian female.   Neck: Supple, no carotid bruits   Cardiac: Regular rate rhythm, soft  systolic murmur   Pulmonary: Clear to auscultation bilaterally   Musculoskeletal: moderate kyphoscoliosis   Neurological examination   Mentation: Alert oriented to time, place, history taking, language fluent, and causual conversation  Cranial nerve II-XII: Pupils were equal round reactive to light extraocular movements were full, visual field were full on confrontational test. facial sensation and strength were normal. hearing diminished mildly bilaterally. Uvula tongue midline. head turning and shoulder shrug and were normal and symmetric.Tongue protrusion into cheek strength was normal. MOTOR: normal bulk and tone, full strength in the BUE, BLE, fine finger movements normal, no pronator drift SENSORY: normal and symmetric to light touch, pinprick, temperature, vibration COORDINATION: finger-nose-finger normal, there was no truncal ataxia REFLEXES: 1+ and symmetric  GAIT/STATION: Rising up from seated position without assistance, stance is tooped, gait demonstrates short steps and mild imbalance. Unsteady on a narrow base and while walking in a straight line. Ambulates with a rolling walker.  DIAGNOSTIC DATA (LABS, IMAGING, TESTING) - I reviewed patient records, labs, notes, testing and imaging myself where available.  Lab Results  Component Value Date   WBC 6.8 03/30/2014   HGB 13.7 03/30/2014   HCT  40.3 03/30/2014   MCV 88.6 03/30/2014   PLT 211 03/30/2014      Component Value Date/Time   NA 137 03/30/2014 0953   K 4.2 03/30/2014 0953   CL 97 03/30/2014 0953   CO2 24 03/30/2014 0953   GLUCOSE 113* 03/30/2014 0953   BUN 15 03/30/2014 0953   CREATININE 0.57 03/30/2014 1630   CREATININE 0.72 11/21/2012 0955   CALCIUM 9.7 03/30/2014 0953   PROT 7.5 11/21/2012 0955   ALBUMIN 4.0 11/21/2012 0955   AST 26 11/21/2012 0955   ALT 11 11/21/2012 0955   ALKPHOS 77 11/21/2012 0955   BILITOT 0.5 11/21/2012 0955   GFRNONAA 79* 03/30/2014 1630   GFRAA >90 03/30/2014 1630   CT HEAD WITHOUT CONTRAST 11/21/12: Negative noncontrast head CT for age.  MR ANGIOGRAM NECK W Wo CONTRAST 12/21/12: This MRA of the neck shows no significant stenosis at either carotid bifurcation in the neck. Both vertebral arteries once antegrade flow with focal area of flow reduction at the origin which may be related to artifact which are common in this location versus focal stenosis.  MR MRA HEAD WO CONTRAST:  11/24/12 This MRA of the brain shows no significant stenosis of the large to medium-sized intracranial vessels.  MRI HEAD WITHOUT CONTRAST 11/23/12 Small (very small) subacute appearing cortically based infarct in the right superior frontal gyrus (right MCA territory, pre motor area).   No associated mass effect or hemorrhage. No other acute intracranial abnormality.  2D ECHO 12/10/12:  Left ventricle: The cavity size was normal. Wall thickness was increased in a pattern of mild LVH. Systolic function was normal. The estimated ejection fraction was in the range of 60% to 65%. Doppler parameters are consistent with abnormal left ventricular relaxation (grade 1 diastolic dysfunction). Aortic valve: Mild to moderate regurgitation. Mitral valve: Mild regurgitation.  ASSESSMENT AND PLAN  81 year old Caucasian lady with sudden onset bilateral painless visual hallucinations of unclear etiology possibilities  include small bilateral occipital infarcts not visualized on MRI given MRI finding of a silent right frontal infarct. Strong suspicion for paroxysmal atrial fibrillation, could not tolerate [employer] monitor, spoke to patient about loop recorder but she does not want it.  Even if paroxysmal atrial fibrillation was found, she and daughter have decided they do not want her to go on blood thinners stronger than aspirin.  Recurrent episodes of transient vision disturbances possibly migraine equivalents. Occipital seizures less likely.  PLAN:  I had a long discussion with the patient and her daughter regarding her episodes of recurrent transient visual disturbances been possibly migraine equivalent. Occipital seizures are less likely. TIAs would be unlikely given recurrent stereotypical nature and absence of any associated symptoms. Recommend trial of Depakote ER 250 mg daily for 2 weeks to be increased to 500 mg daily if tolerated. Check EEG for seizure activity. Return for follow-up in 3 months or call earlier if necessary.   Delia Heady, MD  08/16/2014, 9:00 AM  [facility] [address] [place], [place] [address] [phone]

## 2014-08-21 ENCOUNTER — Other Ambulatory Visit: Payer: 59 | Admitting: Radiology

## 2014-08-31 ENCOUNTER — Other Ambulatory Visit (INDEPENDENT_AMBULATORY_CARE_PROVIDER_SITE_OTHER): Payer: 59 | Admitting: Neurology

## 2014-08-31 DIAGNOSIS — G43109 Migraine with aura, not intractable, without status migrainosus: Secondary | ICD-10-CM

## 2014-09-07 ENCOUNTER — Other Ambulatory Visit: Payer: Self-pay | Admitting: Neurology

## 2014-09-07 DIAGNOSIS — G43109 Migraine with aura, not intractable, without status migrainosus: Secondary | ICD-10-CM

## 2014-09-18 ENCOUNTER — Telehealth: Payer: Self-pay | Admitting: Neurology

## 2014-09-18 NOTE — Telephone Encounter (Signed)
Patient requesting EEG results.  Please call and advise. °

## 2014-09-20 NOTE — Telephone Encounter (Signed)
Spoke with patient and informed her that EEG was normal, also informed patient of future appointment 11/23/14 at 2:15 with Dr Pearlean BrownieSethi, and to continue to follow current treatment plan.

## 2014-10-20 ENCOUNTER — Other Ambulatory Visit (HOSPITAL_COMMUNITY): Payer: Self-pay

## 2014-10-20 ENCOUNTER — Emergency Department (HOSPITAL_COMMUNITY): Payer: Medicare Other

## 2014-10-20 ENCOUNTER — Inpatient Hospital Stay (HOSPITAL_COMMUNITY)
Admission: EM | Admit: 2014-10-20 | Discharge: 2014-10-25 | DRG: 190 | Disposition: A | Discharge status: Home Health | Payer: Medicare Other | Attending: Internal Medicine | Admitting: Internal Medicine

## 2014-10-20 ENCOUNTER — Encounter (HOSPITAL_COMMUNITY): Payer: Self-pay | Admitting: Emergency Medicine

## 2014-10-20 DIAGNOSIS — H409 Unspecified glaucoma: Secondary | ICD-10-CM | POA: Diagnosis present

## 2014-10-20 DIAGNOSIS — E876 Hypokalemia: Secondary | ICD-10-CM | POA: Diagnosis not present

## 2014-10-20 DIAGNOSIS — E871 Hypo-osmolality and hyponatremia: Secondary | ICD-10-CM | POA: Diagnosis present

## 2014-10-20 DIAGNOSIS — J9601 Acute respiratory failure with hypoxia: Secondary | ICD-10-CM | POA: Diagnosis present

## 2014-10-20 DIAGNOSIS — Z7982 Long term (current) use of aspirin: Secondary | ICD-10-CM

## 2014-10-20 DIAGNOSIS — F419 Anxiety disorder, unspecified: Secondary | ICD-10-CM | POA: Diagnosis present

## 2014-10-20 DIAGNOSIS — Z9049 Acquired absence of other specified parts of digestive tract: Secondary | ICD-10-CM | POA: Diagnosis present

## 2014-10-20 DIAGNOSIS — Z66 Do not resuscitate: Secondary | ICD-10-CM | POA: Diagnosis present

## 2014-10-20 DIAGNOSIS — J9621 Acute and chronic respiratory failure with hypoxia: Secondary | ICD-10-CM | POA: Diagnosis not present

## 2014-10-20 DIAGNOSIS — K5900 Constipation, unspecified: Secondary | ICD-10-CM | POA: Diagnosis present

## 2014-10-20 DIAGNOSIS — J441 Chronic obstructive pulmonary disease with (acute) exacerbation: Principal | ICD-10-CM | POA: Diagnosis present

## 2014-10-20 DIAGNOSIS — G4700 Insomnia, unspecified: Secondary | ICD-10-CM | POA: Diagnosis present

## 2014-10-20 DIAGNOSIS — Z8673 Personal history of transient ischemic attack (TIA), and cerebral infarction without residual deficits: Secondary | ICD-10-CM

## 2014-10-20 DIAGNOSIS — Z90711 Acquired absence of uterus with remaining cervical stump: Secondary | ICD-10-CM | POA: Diagnosis present

## 2014-10-20 DIAGNOSIS — E860 Dehydration: Secondary | ICD-10-CM | POA: Diagnosis present

## 2014-10-20 DIAGNOSIS — R0789 Other chest pain: Secondary | ICD-10-CM | POA: Diagnosis present

## 2014-10-20 DIAGNOSIS — Z79899 Other long term (current) drug therapy: Secondary | ICD-10-CM | POA: Diagnosis not present

## 2014-10-20 DIAGNOSIS — G8929 Other chronic pain: Secondary | ICD-10-CM | POA: Diagnosis present

## 2014-10-20 DIAGNOSIS — J209 Acute bronchitis, unspecified: Secondary | ICD-10-CM | POA: Diagnosis not present

## 2014-10-20 DIAGNOSIS — I10 Essential (primary) hypertension: Secondary | ICD-10-CM | POA: Diagnosis present

## 2014-10-20 DIAGNOSIS — R1310 Dysphagia, unspecified: Secondary | ICD-10-CM | POA: Diagnosis present

## 2014-10-20 DIAGNOSIS — R0602 Shortness of breath: Secondary | ICD-10-CM

## 2014-10-20 DIAGNOSIS — K219 Gastro-esophageal reflux disease without esophagitis: Secondary | ICD-10-CM | POA: Diagnosis present

## 2014-10-20 DIAGNOSIS — Z885 Allergy status to narcotic agent status: Secondary | ICD-10-CM | POA: Diagnosis not present

## 2014-10-20 DIAGNOSIS — J069 Acute upper respiratory infection, unspecified: Secondary | ICD-10-CM

## 2014-10-20 DIAGNOSIS — R0603 Acute respiratory distress: Secondary | ICD-10-CM | POA: Diagnosis present

## 2014-10-20 DIAGNOSIS — M4800 Spinal stenosis, site unspecified: Secondary | ICD-10-CM | POA: Diagnosis present

## 2014-10-20 LAB — I-STAT ARTERIAL BLOOD GAS, ED
Acid-Base Excess: 1 mmol/L (ref 0.0–2.0)
BICARBONATE: 29.7 meq/L — AB (ref 20.0–24.0)
O2 Saturation: 100 %
PCO2 ART: 60.8 mmHg — AB (ref 35.0–45.0)
PH ART: 7.296 — AB (ref 7.350–7.450)
TCO2: 31 mmol/L (ref 0–100)
pO2, Arterial: 369 mmHg — ABNORMAL HIGH (ref 80.0–100.0)

## 2014-10-20 LAB — BASIC METABOLIC PANEL
ANION GAP: 12 (ref 5–15)
BUN: 22 mg/dL (ref 6–23)
CO2: 23 mmol/L (ref 19–32)
Calcium: 9.3 mg/dL (ref 8.4–10.5)
Chloride: 87 mmol/L — ABNORMAL LOW (ref 96–112)
Creatinine, Ser: 0.68 mg/dL (ref 0.50–1.10)
GFR calc non Af Amer: 74 mL/min — ABNORMAL LOW (ref >90)
GFR, EST AFRICAN AMERICAN: 86 mL/min — AB (ref >90)
Glucose, Bld: 102 mg/dL — ABNORMAL HIGH (ref 70–99)
POTASSIUM: 4.2 mmol/L (ref 3.5–5.1)
SODIUM: 122 mmol/L — AB (ref 135–145)

## 2014-10-20 LAB — URINALYSIS, ROUTINE W REFLEX MICROSCOPIC
Bilirubin Urine: NEGATIVE
GLUCOSE, UA: NEGATIVE mg/dL
Hgb urine dipstick: NEGATIVE
Ketones, ur: NEGATIVE mg/dL
Leukocytes, UA: NEGATIVE
Nitrite: NEGATIVE
PROTEIN: 30 mg/dL — AB
Specific Gravity, Urine: 1.023 (ref 1.005–1.030)
Urobilinogen, UA: 0.2 mg/dL (ref 0.0–1.0)
pH: 6 (ref 5.0–8.0)

## 2014-10-20 LAB — CBC WITH DIFFERENTIAL/PLATELET
BASOS ABS: 0 10*3/uL (ref 0.0–0.1)
Basophils Relative: 0 % (ref 0–1)
EOS PCT: 0 % (ref 0–5)
Eosinophils Absolute: 0 10*3/uL (ref 0.0–0.7)
HEMATOCRIT: 38.7 % (ref 36.0–46.0)
HEMOGLOBIN: 13.3 g/dL (ref 12.0–15.0)
Lymphocytes Relative: 18 % (ref 12–46)
Lymphs Abs: 2 10*3/uL (ref 0.7–4.0)
MCH: 30 pg (ref 26.0–34.0)
MCHC: 34.4 g/dL (ref 30.0–36.0)
MCV: 87.2 fL (ref 78.0–100.0)
MONO ABS: 1.3 10*3/uL — AB (ref 0.1–1.0)
Monocytes Relative: 12 % (ref 3–12)
NEUTROS ABS: 7.6 10*3/uL (ref 1.7–7.7)
Neutrophils Relative %: 70 % (ref 43–77)
Platelets: 258 10*3/uL (ref 150–400)
RBC: 4.44 MIL/uL (ref 3.87–5.11)
RDW: 12.7 % (ref 11.5–15.5)
WBC: 11 10*3/uL — ABNORMAL HIGH (ref 4.0–10.5)

## 2014-10-20 LAB — URINE MICROSCOPIC-ADD ON

## 2014-10-20 LAB — MAGNESIUM: MAGNESIUM: 1.5 mg/dL (ref 1.5–2.5)

## 2014-10-20 LAB — I-STAT TROPONIN, ED: TROPONIN I, POC: 0.06 ng/mL (ref 0.00–0.08)

## 2014-10-20 LAB — TSH: TSH: 1.937 u[IU]/mL (ref 0.350–4.500)

## 2014-10-20 LAB — TROPONIN I
TROPONIN I: 0.05 ng/mL — AB (ref <0.031)
TROPONIN I: 0.04 ng/mL — AB (ref <0.031)

## 2014-10-20 LAB — BRAIN NATRIURETIC PEPTIDE: B NATRIURETIC PEPTIDE 5: 628.6 pg/mL — AB (ref 0.0–100.0)

## 2014-10-20 MED ORDER — SODIUM CHLORIDE 0.9 % IV SOLN
INTRAVENOUS | Status: AC
  Administered 2014-10-20: 18:00:00 via INTRAVENOUS

## 2014-10-20 MED ORDER — ALPRAZOLAM 0.25 MG PO TABS
0.2500 mg | ORAL_TABLET | Freq: Two times a day (BID) | ORAL | Status: DC
  Administered 2014-10-20: 0.25 mg via ORAL
  Filled 2014-10-20: qty 1

## 2014-10-20 MED ORDER — ENOXAPARIN SODIUM 30 MG/0.3ML ~~LOC~~ SOLN
30.0000 mg | SUBCUTANEOUS | Status: DC
  Administered 2014-10-20: 30 mg via SUBCUTANEOUS
  Filled 2014-10-20 (×2): qty 0.3

## 2014-10-20 MED ORDER — ACETAMINOPHEN 325 MG PO TABS: 650.0000 mg | ORAL_TABLET | Freq: Four times a day (QID) | ORAL | Status: DC | PRN

## 2014-10-20 MED ORDER — SODIUM CHLORIDE 0.9 % IJ SOLN
3.0000 mL | Freq: Two times a day (BID) | INTRAMUSCULAR | Status: DC
  Administered 2014-10-20 – 2014-10-25 (×10): 3 mL via INTRAVENOUS

## 2014-10-20 MED ORDER — SODIUM CHLORIDE 0.9 % IV SOLN: INTRAVENOUS | Status: DC

## 2014-10-20 MED ORDER — DOXYCYCLINE HYCLATE 100 MG IV SOLR
100.0000 mg | Freq: Two times a day (BID) | INTRAVENOUS | Status: DC
  Administered 2014-10-20 – 2014-10-23 (×6): 100 mg via INTRAVENOUS
  Filled 2014-10-20 (×7): qty 100

## 2014-10-20 MED ORDER — LEVOFLOXACIN IN D5W 750 MG/150ML IV SOLN
750.0000 mg | Freq: Once | INTRAVENOUS | Status: DC
  Administered 2014-10-20: 750 mg via INTRAVENOUS
  Filled 2014-10-20: qty 150

## 2014-10-20 MED ORDER — ALBUTEROL SULFATE (2.5 MG/3ML) 0.083% IN NEBU: 2.5000 mg | INHALATION_SOLUTION | RESPIRATORY_TRACT | Status: DC | PRN

## 2014-10-20 MED ORDER — METHYLPREDNISOLONE SODIUM SUCC 40 MG IJ SOLR
40.0000 mg | Freq: Two times a day (BID) | INTRAMUSCULAR | Status: DC
  Administered 2014-10-20 – 2014-10-21 (×3): 40 mg via INTRAVENOUS
  Filled 2014-10-20 (×4): qty 1

## 2014-10-20 MED ORDER — GABAPENTIN 300 MG PO CAPS
300.0000 mg | ORAL_CAPSULE | Freq: Three times a day (TID) | ORAL | Status: DC
  Administered 2014-10-20 – 2014-10-25 (×14): 300 mg via ORAL
  Filled 2014-10-20 (×18): qty 1

## 2014-10-20 MED ORDER — ALPRAZOLAM 0.25 MG PO TABS: 0.2500 mg | ORAL_TABLET | Freq: Once | ORAL | Status: DC

## 2014-10-20 MED ORDER — LEVOFLOXACIN IN D5W 750 MG/150ML IV SOLN
750.0000 mg | INTRAVENOUS | Status: DC
  Filled 2014-10-20: qty 150

## 2014-10-20 MED ORDER — ALBUTEROL (5 MG/ML) CONTINUOUS INHALATION SOLN
10.0000 mg/h | INHALATION_SOLUTION | RESPIRATORY_TRACT | Status: DC
  Administered 2014-10-20: 10 mg/h via RESPIRATORY_TRACT

## 2014-10-20 MED ORDER — SODIUM CHLORIDE 0.9 % IV SOLN: Freq: Once | INTRAVENOUS | Status: DC

## 2014-10-20 MED ORDER — ZOLPIDEM TARTRATE 5 MG PO TABS
5.0000 mg | ORAL_TABLET | Freq: Every day | ORAL | Status: DC
  Administered 2014-10-20: 5 mg via ORAL
  Filled 2014-10-20: qty 1

## 2014-10-20 MED ORDER — IPRATROPIUM BROMIDE 0.02 % IN SOLN
0.5000 mg | Freq: Once | RESPIRATORY_TRACT | Status: AC
  Administered 2014-10-20: 0.5 mg via RESPIRATORY_TRACT
  Filled 2014-10-20: qty 2.5

## 2014-10-20 MED ORDER — IPRATROPIUM-ALBUTEROL 0.5-2.5 (3) MG/3ML IN SOLN
3.0000 mL | Freq: Four times a day (QID) | RESPIRATORY_TRACT | Status: DC
Start: 2014-10-20 — End: 2014-10-21
  Administered 2014-10-20 – 2014-10-21 (×4): 3 mL via RESPIRATORY_TRACT
  Filled 2014-10-20 (×4): qty 3

## 2014-10-20 MED ORDER — ACETAMINOPHEN 325 MG PO TABS
650.0000 mg | ORAL_TABLET | Freq: Once | ORAL | Status: AC
  Administered 2014-10-20: 650 mg via ORAL
  Filled 2014-10-20: qty 2

## 2014-10-20 MED ORDER — GUAIFENESIN ER 600 MG PO TB12
1200.0000 mg | ORAL_TABLET | Freq: Two times a day (BID) | ORAL | Status: DC
  Administered 2014-10-20 – 2014-10-24 (×8): 1200 mg via ORAL
  Filled 2014-10-20 (×10): qty 2

## 2014-10-20 MED ORDER — LISINOPRIL 20 MG PO TABS
20.0000 mg | ORAL_TABLET | Freq: Every day | ORAL | Status: DC
  Administered 2014-10-20 – 2014-10-25 (×6): 20 mg via ORAL
  Filled 2014-10-20 (×6): qty 1

## 2014-10-20 MED ORDER — ONDANSETRON HCL 4 MG/2ML IJ SOLN: 4.0000 mg | Freq: Four times a day (QID) | INTRAMUSCULAR | Status: DC | PRN

## 2014-10-20 MED ORDER — ACETAMINOPHEN 650 MG RE SUPP: 650.0000 mg | Freq: Four times a day (QID) | RECTAL | Status: DC | PRN

## 2014-10-20 MED ORDER — LEVALBUTEROL HCL 0.63 MG/3ML IN NEBU
0.6300 mg | INHALATION_SOLUTION | Freq: Four times a day (QID) | RESPIRATORY_TRACT | Status: DC
  Administered 2014-10-20: 0.63 mg via RESPIRATORY_TRACT
  Filled 2014-10-20: qty 3

## 2014-10-20 MED ORDER — ALUM & MAG HYDROXIDE-SIMETH 200-200-20 MG/5ML PO SUSP: 30.0000 mL | Freq: Four times a day (QID) | ORAL | Status: DC | PRN

## 2014-10-20 MED ORDER — LEVALBUTEROL HCL 0.63 MG/3ML IN NEBU
0.6300 mg | INHALATION_SOLUTION | Freq: Four times a day (QID) | RESPIRATORY_TRACT | Status: DC | PRN
  Filled 2014-10-20: qty 3

## 2014-10-20 MED ORDER — DOCUSATE SODIUM 100 MG PO CAPS
100.0000 mg | ORAL_CAPSULE | Freq: Two times a day (BID) | ORAL | Status: DC
  Administered 2014-10-20 – 2014-10-25 (×7): 100 mg via ORAL
  Filled 2014-10-20 (×13): qty 1

## 2014-10-20 MED ORDER — ALBUTEROL SULFATE (2.5 MG/3ML) 0.083% IN NEBU
10.0000 mg | INHALATION_SOLUTION | RESPIRATORY_TRACT | Status: DC
  Filled 2014-10-20: qty 12

## 2014-10-20 MED ORDER — ONDANSETRON HCL 4 MG PO TABS: 4.0000 mg | ORAL_TABLET | Freq: Four times a day (QID) | ORAL | Status: DC | PRN

## 2014-10-20 NOTE — Progress Notes (Signed)
Report received from ED nurse Danielle for patient to be admitted into 5w10

## 2014-10-20 NOTE — H&P (Signed)
[facility] History and Physical                                                                                    Janet Brooks, is a 81 y.o. female  MRN: [id number]   DOB - Oct 08, 1920  Admit Date - 10/20/2014  Outpatient Primary MD for the patient is [name],[name], MD  With History of -  Past Medical History  Diagnosis Date  . Glaucoma   . Spinal stenosis   . Hypertension       Past Surgical History  Procedure Laterality Date  . Partial hysterectomy    . Back surgery      in for   Chief Complaint  Patient presents with  . Shortness of Breath  . Chest Pain     HPI  Janet KeelLucille Rispoli  is a 81 y.o. female, with a past medical history of CVA, hypertension, anxiety, and spinal stenosis for which she sees a chronic pain management doctor (Dr. Vear ClockPhillips [facility] [place]). Patient is currently receiving a continuous neb so much of history is collected from her daughter at bedside. Her daughter reports that patient developed laryngitis last Saturday, March 19. On Monday she began wheezing. This is very atypical for her as she has no history of asthma or COPD. She was taken to the PCP on Tuesday who prescribed an inhaler, prednisone, and a Z-Pak. Her daughter felt that the patient was unable to use her inhaler properly. The patient continued to get worse with increased wheeze. She has also developed difficulty swallowing pills with this illness. She has no history of dysphagia in the past. The patient complains of constipation and some pain on her left side that started yesterday. In the ER she developed chest pain that feels like a band across her breast line. It does not radiate. It feels like a pressure. The patient believes it may be related to her anxiety or hiatal hernia.  The patient presented in the ER with moderate respiratory distress, wheezing, retractions, only able to say a few words at a time. After receiving treatment in the ER she has improved somewhat but  still has difficulty speaking. Labs are significant for a sodium of 122, chloride 87, white count 11.  Chest x-ray was negative for opacity or infiltrate. EKG was reported to show new T-wave inversions. Troponin was within normal limits at 0.06. We will admit the patient to telemetry for respiratory distress with COPD? Exacerbation.  Review of Systems   In addition to the HPI above,  No Fever-chills, No Headache, No changes with Vision or hearing, No Abdominal pain, No Nausea or Vomiting, Bowel movements are normally constipated No Blood in stool or Urine, No dysuria, No new skin rashes or bruises, No new joints pains-aches,  No new weakness, tingling, numbness in any extremity, No recent weight gain or loss, A full 10 point Review of Systems was done, except as stated above, all other Review of Systems were negative.  Social History History  Substance Use Topics  . Smoking status: Never Smoker   . Smokeless tobacco: Never Used  . Alcohol Use: No    Family History Family History  Problem Relation Age of Onset  . Stroke Mother    her father had asthma. The daughter reports multiple family members with asthma.   Prior to Admission medications   Medication Sig Start Date End Date Taking? Authorizing Provider  ALPRAZolam (XANAX) 0.25 MG tablet Take 0.25-0.5 mg by mouth daily as needed for anxiety.    Historical Provider, MD  aspirin EC 325 MG tablet Take 1 tablet (325 mg total) by mouth daily. 11/24/12   Micki Riley, MD  divalproex (DEPAKOTE ER) 250 MG 24 hr tablet Take 2 tablets (500 mg total) by mouth daily. Start one tablet daily for 2 weeks then 2 tablets daily 08/15/14   Micki Riley, MD  dorzolamide (TRUSOPT) 2 % ophthalmic solution Place 1 drop into both eyes every 12 (twelve) hours.      Historical Provider, MD  escitalopram (LEXAPRO) 10 MG tablet Take 10 mg by mouth daily.    Historical Provider, MD  gabapentin (NEURONTIN) 100 MG capsule Take 300 mg by mouth every 8  (eight) hours.     Historical Provider, MD  hydrochlorothiazide (HYDRODIURIL) 25 MG tablet Take 25 mg by mouth daily.      Historical Provider, MD  lisinopril (PRINIVIL,ZESTRIL) 20 MG tablet Take 20 mg by mouth daily. 05/31/14   Historical Provider, MD  propranolol (INDERAL LA) 80 MG 24 hr capsule Take 80 mg by mouth daily.      Historical Provider, MD  timolol (BETIMOL) 0.5 % ophthalmic solution Place 1 drop into both eyes every 12 (twelve) hours.      Historical Provider, MD  zolpidem (AMBIEN) 10 MG tablet Take 10 mg by mouth at bedtime as needed for sleep.    Historical Provider, MD    Allergies  Allergen Reactions  . Codeine Itching and Rash    Physical Exam  Vitals  Blood pressure 163/66, pulse 58, temperature 98.2 F (36.8 C), temperature source Oral, resp. rate 20, height  (1.499 m), weight 49.896 kg (110 lb), SpO2 100 %.   General:  thin, frail, elderly female in mild respiratory distress receiving a continuous neb.   appears stated age   Psych:  Normal affect and insight, Not Suicidal or Homicidal, Awake Alert, Oriented X 3.slightly anxious   Neuro:   No F.N deficits, ALL C.Nerves Intact, Strength 5/5 all 4 extremities, Sensation intact all 4 extremities.  ENT:  Ears and Eyes appear Normal, Conjunctivae clear, PER. Moist oral mucosa without erythema or exudates.  Neck:  Supple, No lymphadenopathy appreciated  Respiratory:  coarse breath sounds and wheeze heard throughout lung areas. Patient with mildly increased work of breathing. She has a significant amount of clear sputum production   Cardiac: tachycardia and irregular, No Murmurs, no LE edema noted, no JVD.    Abdomen:  Positive bowel sounds, Soft, Non tender, Non distended,  No masses appreciated, no CVA tenderness.   Skin:  No Cyanosis, Normal Skin Turgor, No Skin Rash or Bruise.  Extremities:  Able to move all 4. 5/5 strength in each,  no effusions.  Data Review  CBC  Recent Labs Lab 10/20/14 1205   WBC 11.0*  HGB 13.3  HCT 38.7  PLT 258  MCV 87.2  MCH 30.0  MCHC 34.4  RDW 12.7  LYMPHSABS 2.0  MONOABS 1.3*  EOSABS 0.0  BASOSABS 0.0    Chemistries   Recent Labs Lab 10/20/14 1205  NA 122*  K 4.2  CL 87*  CO2 23  GLUCOSE 102*  BUN 22  CREATININE 0.68  CALCIUM 9.3    Urinalysis: Pending   Imaging results:   Dg Chest 2 View  10/20/2014   CLINICAL DATA:  Shortness of breath, cough  EXAM: CHEST  2 VIEW  COMPARISON:  03/30/2014  FINDINGS: Mild right basilar scarring/ atelectasis. No focal consolidation. No pleural effusion or pneumothorax.  The heart is top-normal in size.  Visualized osseous structures are within normal limits.  IMPRESSION: No evidence of acute cardiopulmonary disease.   Electronically Signed   By: Charline Bills M.D.   On: 10/20/2014 13:13    EKG shows: Prolonged QTC. An irregular ventricle rhythm with PVCs.   Assessment & Plan  Principal Problem:   Acute respiratory distress Active Problems:   COPD exacerbation   Anxiety   Atypical chest pain   Spinal stenosis   Constipation   Hyponatremia  Acute respiratory distress with COPD-like exacerbation  patient's respiratory distress has improved in the ER. We will admit her to a telemetry bed. Start IV doxycycline (given prolonged QTC), Xopenex nebulizers, Mucinex, Solu-Medrol 40 mg twice a day.  Chest tightness-atypical chest pain Reproducible on palpation. Given patient's unusual rhythm on EKG and chest tightness we will cycle her enzymes, check a 2-D echo and BNP.  Anxiety Continue patient's scheduled Xanax, and Ambien 10 mg as she takes at home. Med rec has not been done by pharmacy.  Lexapro, Depakote and other medications need to be reviewed and ordered after pharm med rec is complete.  Spinal stenosis with chronic pain Continue gabapentin.   Hyponatremia (NA 122) No neurologic symptoms. Give gentle IVF and recheck bmet in am. Likely from Dehydration and HCTZ.  Will hold  HCTZ.  HTN Hold HCTZ.  Continue Lisinopril.  Will add hydralazine PRN.  Dysphagia to pills  Speech eval requested.   DVT Prophylaxis: lovenox  AM Labs Ordered, also please review Full Orders  Family Communication:   dtr at bedside.  Code Status:  DNR  Condition:  Guarded.  Time spent in minutes : [address],  PA-C on 10/20/2014 at 3:01 PM  Between 7am to 7pm - Pager - [phone]  After 7pm go to [email or web address] - password [email or web address]  And look for the night coverage person covering me after hours  [facility]

## 2014-10-20 NOTE — ED Provider Notes (Signed)
CSN: [id number]     Arrival date & time 10/20/14  1151 History   First MD Initiated Contact with Patient 10/20/14 1154     Chief Complaint  Patient presents with  . Shortness of Breath  . Chest Pain     (Consider location/radiation/quality/duration/timing/severity/associated sxs/prior Treatment) Patient is a 81 y.o. female presenting with URI. The history is provided by the patient.  URI Presenting symptoms: congestion and cough   Presenting symptoms: no fever   Severity:  Moderate Onset quality:  Gradual Duration:  4 days Timing:  Constant Progression:  Worsening Chronicity:  New Relieved by:  Nothing Worsened by:  Nothing tried Associated symptoms: wheezing   Risk factors: being elderly and chronic respiratory disease   Risk factors: no chronic cardiac disease, no chronic kidney disease and no immunosuppression     Past Medical History  Diagnosis Date  . Glaucoma   . Spinal stenosis   . Hypertension    Past Surgical History  Procedure Laterality Date  . Partial hysterectomy    . Back surgery     Family History  Problem Relation Age of Onset  . Stroke Mother    History  Substance Use Topics  . Smoking status: Never Smoker   . Smokeless tobacco: Never Used  . Alcohol Use: No   OB History    No data available     Review of Systems  Constitutional: Negative for fever.  HENT: Positive for congestion.   Respiratory: Positive for cough and wheezing.   All other systems reviewed and are negative.     Allergies  Codeine  Home Medications   Prior to Admission medications   Medication Sig Start Date End Date Taking? Authorizing Provider  ALPRAZolam (XANAX) 0.25 MG tablet Take 0.25-0.5 mg by mouth daily as needed for anxiety.    Historical Provider, MD  aspirin EC 325 MG tablet Take 1 tablet (325 mg total) by mouth daily. 11/24/12   Micki Riley, MD  divalproex (DEPAKOTE ER) 250 MG 24 hr tablet Take 2 tablets (500 mg total) by mouth daily. Start one  tablet daily for 2 weeks then 2 tablets daily 08/15/14   Micki Riley, MD  dorzolamide (TRUSOPT) 2 % ophthalmic solution Place 1 drop into both eyes every 12 (twelve) hours.      Historical Provider, MD  escitalopram (LEXAPRO) 10 MG tablet Take 10 mg by mouth daily.    Historical Provider, MD  gabapentin (NEURONTIN) 100 MG capsule Take 300 mg by mouth every 8 (eight) hours.     Historical Provider, MD  hydrochlorothiazide (HYDRODIURIL) 25 MG tablet Take 25 mg by mouth daily.      Historical Provider, MD  lisinopril (PRINIVIL,ZESTRIL) 20 MG tablet Take 20 mg by mouth daily. 05/31/14   Historical Provider, MD  propranolol (INDERAL LA) 80 MG 24 hr capsule Take 80 mg by mouth daily.      Historical Provider, MD  timolol (BETIMOL) 0.5 % ophthalmic solution Place 1 drop into both eyes every 12 (twelve) hours.      Historical Provider, MD  zolpidem (AMBIEN) 10 MG tablet Take 10 mg by mouth at bedtime as needed for sleep.    Historical Provider, MD   BP 130/92 mmHg  Pulse 61  Temp(Src) 98.2 F (36.8 C) (Oral)  Resp 14  Ht  (1.499 m)  Wt 110 lb (49.896 kg)  BMI 22.21 kg/m2  SpO2 100% Physical Exam  Constitutional: She is oriented to person, place, and time. She appears  well-developed and well-nourished. She appears distressed.  Elderly female in moderate respiratory distress. Speaking in short phrases.  HENT:  Head: Normocephalic and atraumatic.  Mouth/Throat: Oropharynx is clear and moist. No oropharyngeal exudate.  Eyes: EOM are normal. Pupils are equal, round, and reactive to light.  Cardiovascular: Normal rate, regular rhythm, normal heart sounds and intact distal pulses.  Exam reveals no gallop and no friction rub.   No murmur heard. Pulmonary/Chest: Tachypnea noted. She is in respiratory distress. She has decreased breath sounds. She has wheezes.  Tachypnea, increased work of breathing, rales at the bases with decreased breath sounds at the base of the left, Diffuse expiratory wheezes   Abdominal: Soft. She exhibits no distension. There is no tenderness.  Musculoskeletal: Normal range of motion. She exhibits no edema.  Neurological: She is alert and oriented to person, place, and time.  Skin: Skin is warm and dry. No rash noted. She is not diaphoretic.  Nursing note and vitals reviewed.   ED Course  Procedures (including critical care time) Labs Review Labs Reviewed  CBC WITH DIFFERENTIAL/PLATELET - Abnormal; Notable for the following:    WBC 11.0 (*)    Monocytes Absolute 1.3 (*)    All other components within normal limits  BASIC METABOLIC PANEL - Abnormal; Notable for the following:    Sodium 122 (*)    Chloride 87 (*)    Glucose, Bld 102 (*)    GFR calc non Af Amer 74 (*)    GFR calc Af Amer 86 (*)    All other components within normal limits  I-STAT TROPOININ, ED  I-STAT ARTERIAL BLOOD GAS, ED    Imaging Review Dg Chest 2 View  10/20/2014   CLINICAL DATA:  Shortness of breath, cough  EXAM: CHEST  2 VIEW  COMPARISON:  03/30/2014  FINDINGS: Mild right basilar scarring/ atelectasis. No focal consolidation. No pleural effusion or pneumothorax.  The heart is top-normal in size.  Visualized osseous structures are within normal limits.  IMPRESSION: No evidence of acute cardiopulmonary disease.   Electronically Signed   By: Charline Bills M.D.   On: 10/20/2014 13:13     EKG Interpretation   Date/Time:  Friday October 20 2014 12:03:09 EDT Ventricular Rate:  57 PR Interval:  159 QRS Duration: 83 QT Interval:  428 QTC Calculation: 417 R Axis:   50 Text Interpretation:  Sinus rhythm Consider left ventricular hypertrophy  Probable inferior infarct, age indeterminate new Abnormal T, consider  ischemia, anterior leads Confirmed by Anitra Lauth  MD, Alphonzo Lemmings ([id number]) on  10/20/2014 12:07:27 PM      MDM   Final diagnoses:  COPD exacerbation  URI (upper respiratory infection)  Chest tightness  Hyponatremia   81 year old female presents with increasing  shortness of breath and productive cough. She has been treated for bronchitis over the last 4 days with azithromycin and steroid burst. No significant improvement. Denies fevers at home. Denies chest pain.  Diffuse wheezes with retractions and tachypnea on initial examination. O2 saturation 94% on room air. Presentation concerning for possible COPD exacerbation in the setting of viral or bacterial infection. Chest x-ray as well as basic labs, EKG obtained. EKG shows sinus arrhythmia with T wave inversions in the anteroseptal leads which are new from previous. Given age and risk factors, we'll also evaluate for cardiac etiology. Nebulized treatments given.  2:00 PM following nebulized treatment, patient is meeting significant for air with increased wheezes. Given increased sputum production as well as COPD exacerbation requiring hospitalization, Levaquin given. Leukocytosis  noted on labs chest x-ray is clear. Troponin negative. Feel this is more likely related to upper respiration infection and not ACS. Hyponatremia also noted which could be secondary to medications she is on versus dehydration. Saline infusion started and will admit to telemetry bed.  Dorna LeitzAlex Raisha Brabender, MD 10/20/14 1358  Gwyneth SproutWhitney Plunkett, MD 10/20/14 1515

## 2014-10-20 NOTE — ED Notes (Signed)
Per EMS: pt from home for intermittent sob that worsened since last night. Pt currently being treated for bronchitis, pt reports productive cough as well. EMS noted wheezing and diminished breath sounds bilaterally. Pt given 2 (5 mg of albuterol and 05 atrovent) breathing treatments en route and 125 mg of solumedrol. NAD noted at this time.

## 2014-10-20 NOTE — Evaluation (Signed)
Clinical/Bedside Swallow Evaluation Patient Details  Name: Janet Brooks MRN: [id number] Date of Birth: Nov 03, 1920  Today's Date: 10/20/2014 Time: SLP Start Time (ACUTE ONLY): 1530 SLP Stop Time (ACUTE ONLY): 1546 SLP Time Calculation (min) (ACUTE ONLY): 16 min  Past Medical History:  Past Medical History  Diagnosis Date  . Glaucoma   . Spinal stenosis   . Hypertension    Past Surgical History:  Past Surgical History  Procedure Laterality Date  . Partial hysterectomy    . Back surgery     HPI:  81 y.o. female admitted with acute respiratory distress, COPD exacerbation, anxiety, dysphagia with pills.  No prior swallow studies per record review.     Assessment / Plan / Recommendation Clinical Impression  Pt presents with s/s of an esophageal dysphagia with c/o globus, early satiety,   repetitive swallows needed to clear bolus material, wet/audible expirations, and eructation post-swallow.  Recommend resuming mechanical soft diet, thin liquids, meds crushed in puree; esophageal precautions.  Hold tray if s/s worsen over w/e or pt demonstrates signs of aspiration.  D/W Dr. Waymon AmatoHongalgi, pt and her dtr.  Pt may benefit from esophageal w/u.       Aspiration Risk  Moderate    Diet Recommendation Dysphagia 3 (Mechanical Soft);Thin liquid   Liquid Administration via: Cup Medication Administration: Crushed with puree Supervision: Patient able to self feed Compensations: Follow solids with liquid Postural Changes and/or Swallow Maneuvers: Seated upright 90 degrees;Upright 30-60 min after meal    Other  Recommendations Recommended Consults: Consider esophageal assessment Oral Care Recommendations: Oral care BID   Follow Up Recommendations  None    Frequency and Duration min 1 x/week  1 week       SLP Swallow Goals     Swallow Study Prior Functional Status       General Date of Onset: 10/20/14 HPI: 81 y.o. female admitted with acute respiratory distress, COPD exacerbation,  anxiety, dysphagia with pills.  No prior swallow studies per record review.   Type of Study: Bedside swallow evaluation Previous Swallow Assessment: none per records Diet Prior to this Study: NPO Temperature Spikes Noted: No Respiratory Status: Room air (94%) Behavior/Cognition: Alert;Cooperative;Pleasant mood Oral Cavity - Dentition: Adequate natural dentition Self-Feeding Abilities: Able to feed self Patient Positioning: Upright in bed Baseline Vocal Quality: Hoarse Volitional Cough: Strong Volitional Swallow: Able to elicit    Oral/Motor/Sensory Function Overall Oral Motor/Sensory Function: Appears within functional limits for tasks assessed   Ice Chips Ice chips: Within functional limits   Thin Liquid Thin Liquid: Impaired Presentation: Cup Pharyngeal  Phase Impairments: Multiple swallows (intermittent, audible wet congestion, but no cough)    Nectar Thick Nectar Thick Liquid: Not tested   Honey Thick Honey Thick Liquid: Not tested   Puree Puree: Within functional limits Presentation: Self Fed   Solid  Janet Brooks L. [place], [place] CCC/SLP Pager [phone]     Solid: Impaired (c/o cracker "getting stuck" ) Presentation: Self Fed       Janet Brooks, Janet Brooks Laurice 10/20/2014,4:09 PM

## 2014-10-20 NOTE — Progress Notes (Signed)
NURSING PROGRESS NOTE  Janet Brooks [id number] Admission Data: 10/20/2014 4:11 PM Attending Provider: Elease EtienneAnand D Hongalgi, MD AOZ:[id number],QIONGPCP:LITTLE,KEVIN Juel BurrowLORNE, MD Code Status: DNR  Janet Brooks is a 81 y.o. female patient admitted from ED:  -No acute distress noted.  -No complaints of shortness of breath.  -No complaints of chest pain.   Cardiac Monitoring: Box # 10 in place. Cardiac monitor yields wandering atrial pacer   Blood pressure 163/66, pulse 58, temperature 98.2 F (36.8 C), temperature source Oral, resp. rate 20, height 4\' 11"  (1.499 m), weight 49.896 kg (110 lb), SpO2 100 %.   IV Fluids:  IV in place, occlusive dsg intact without redness, IV cath antecubital left, condition patent and no redness normal saline.   Allergies:  Codeine  Past Medical History:   has a past medical history of Glaucoma; Spinal stenosis; and Hypertension.  Past Surgical History:   has past surgical history that includes Partial hysterectomy and Back surgery.  Social History:   reports that she has never smoked. She has never used smokeless tobacco. She reports that she does not drink alcohol or use illicit drugs.  Skin: intact  Patient/Family orientated to room. Information packet given to patient/family. Admission inpatient armband information verified with patient/family to include name and date of birth and placed on patient arm. Side rails up x 2, fall assessment and education completed with patient/family. Patient/family able to verbalize understanding of risk associated with falls and verbalized understanding to call for assistance before getting out of bed. Call light within reach. Patient/family able to voice and demonstrate understanding of unit orientation instructions.    Will continue to evaluate and treat per MD orders.  Janet Parsonsattha Alice Vitelli, RN

## 2014-10-20 NOTE — ED Notes (Signed)
Pt daughter reported that pt was having increase in chest discomfort.  Pt is in NAD and new EKG was obtained.  MD made aware and provided with new EKG.  Will continue to monitor pt.

## 2014-10-21 DIAGNOSIS — F419 Anxiety disorder, unspecified: Secondary | ICD-10-CM

## 2014-10-21 LAB — CBC
HCT: 36.9 % (ref 36.0–46.0)
Hemoglobin: 12.8 g/dL (ref 12.0–15.0)
MCH: 29.8 pg (ref 26.0–34.0)
MCHC: 34.7 g/dL (ref 30.0–36.0)
MCV: 85.8 fL (ref 78.0–100.0)
Platelets: 209 10*3/uL (ref 150–400)
RBC: 4.3 MIL/uL (ref 3.87–5.11)
RDW: 12.4 % (ref 11.5–15.5)
WBC: 6.6 10*3/uL (ref 4.0–10.5)

## 2014-10-21 LAB — TROPONIN I: TROPONIN I: 0.04 ng/mL — AB (ref <0.031)

## 2014-10-21 LAB — INFLUENZA PANEL BY PCR (TYPE A & B)
H1N1 flu by pcr: NOT DETECTED
Influenza A By PCR: NEGATIVE
Influenza B By PCR: NEGATIVE

## 2014-10-21 LAB — BASIC METABOLIC PANEL
Anion gap: 6 (ref 5–15)
BUN: 16 mg/dL (ref 6–23)
CALCIUM: 8.7 mg/dL (ref 8.4–10.5)
CHLORIDE: 90 mmol/L — AB (ref 96–112)
CO2: 26 mmol/L (ref 19–32)
Creatinine, Ser: 0.65 mg/dL (ref 0.50–1.10)
GFR calc non Af Amer: 75 mL/min — ABNORMAL LOW (ref >90)
GFR, EST AFRICAN AMERICAN: 87 mL/min — AB (ref >90)
Glucose, Bld: 120 mg/dL — ABNORMAL HIGH (ref 70–99)
Potassium: 3.6 mmol/L (ref 3.5–5.1)
SODIUM: 122 mmol/L — AB (ref 135–145)

## 2014-10-21 LAB — OSMOLALITY, URINE: OSMOLALITY UR: 678 mosm/kg (ref 390–1090)

## 2014-10-21 LAB — OSMOLALITY: Osmolality: 257 mOsm/kg — ABNORMAL LOW (ref 275–300)

## 2014-10-21 MED ORDER — ALPRAZOLAM 0.25 MG PO TABS
0.2500 mg | ORAL_TABLET | Freq: Two times a day (BID) | ORAL | Status: DC | PRN
  Administered 2014-10-21 – 2014-10-24 (×4): 0.25 mg via ORAL
  Filled 2014-10-21 (×4): qty 1

## 2014-10-21 MED ORDER — DIPHENHYDRAMINE HCL 25 MG PO CAPS
25.0000 mg | ORAL_CAPSULE | Freq: Once | ORAL | Status: DC
Start: 2014-10-21 — End: 2014-10-21
  Filled 2014-10-21: qty 1

## 2014-10-21 MED ORDER — DORZOLAMIDE HCL 2 % OP SOLN
1.0000 [drp] | Freq: Two times a day (BID) | OPHTHALMIC | Status: DC
  Administered 2014-10-21 – 2014-10-25 (×8): 1 [drp] via OPHTHALMIC
  Filled 2014-10-21: qty 10

## 2014-10-21 MED ORDER — METHYLPREDNISOLONE SODIUM SUCC 40 MG IJ SOLR
40.0000 mg | INTRAMUSCULAR | Status: DC
  Administered 2014-10-22 – 2014-10-23 (×2): 40 mg via INTRAVENOUS
  Filled 2014-10-21 (×2): qty 1

## 2014-10-21 MED ORDER — SODIUM CHLORIDE 0.9 % IV SOLN
INTRAVENOUS | Status: DC
  Administered 2014-10-21: 50 mL/h via INTRAVENOUS

## 2014-10-21 MED ORDER — PNEUMOCOCCAL VAC POLYVALENT 25 MCG/0.5ML IJ INJ
0.5000 mL | INJECTION | INTRAMUSCULAR | Status: DC
  Filled 2014-10-21 (×3): qty 0.5

## 2014-10-21 MED ORDER — ESCITALOPRAM OXALATE 10 MG PO TABS
10.0000 mg | ORAL_TABLET | Freq: Every day | ORAL | Status: DC
  Administered 2014-10-21 – 2014-10-25 (×5): 10 mg via ORAL
  Filled 2014-10-21 (×5): qty 1

## 2014-10-21 MED ORDER — ZOLPIDEM TARTRATE 5 MG PO TABS
10.0000 mg | ORAL_TABLET | Freq: Every day | ORAL | Status: DC
  Administered 2014-10-21 – 2014-10-24 (×4): 10 mg via ORAL
  Filled 2014-10-21 (×4): qty 2

## 2014-10-21 MED ORDER — IPRATROPIUM-ALBUTEROL 0.5-2.5 (3) MG/3ML IN SOLN
3.0000 mL | Freq: Four times a day (QID) | RESPIRATORY_TRACT | Status: DC
  Administered 2014-10-21 – 2014-10-23 (×9): 3 mL via RESPIRATORY_TRACT
  Filled 2014-10-21 (×9): qty 3

## 2014-10-21 MED ORDER — IPRATROPIUM-ALBUTEROL 0.5-2.5 (3) MG/3ML IN SOLN: 3.0000 mL | RESPIRATORY_TRACT | Status: DC | PRN

## 2014-10-21 MED ORDER — MENTHOL 3 MG MT LOZG
1.0000 | LOZENGE | OROMUCOSAL | Status: DC | PRN
  Administered 2014-10-21: 3 mg via ORAL
  Filled 2014-10-21: qty 9

## 2014-10-21 MED ORDER — BENZONATATE 100 MG PO CAPS
100.0000 mg | ORAL_CAPSULE | Freq: Two times a day (BID) | ORAL | Status: DC | PRN
  Filled 2014-10-21: qty 1

## 2014-10-21 MED ORDER — TIMOLOL MALEATE 0.5 % OP SOLN
1.0000 [drp] | Freq: Two times a day (BID) | OPHTHALMIC | Status: DC
  Administered 2014-10-21 – 2014-10-25 (×7): 1 [drp] via OPHTHALMIC
  Filled 2014-10-21: qty 5

## 2014-10-21 NOTE — Evaluation (Addendum)
Physical Therapy Evaluation Patient Details Name: Janet CravenLucille C Raymond MRN: [id number] DOB: [birth year] Today's Date: 10/21/2014   History of Present Illness  81 y.o. female admitted to [facility] on 10/20/14 for SOB and chest pain.  Medical and cardiac workup in progress.  Dx with acute respiratory failure secondary to acute viral bronchitis.  Pt is (-) for flu.  Significant past medical history includes: glaucoma, spinal stenosis (per pt report with L leg radicular symptoms), HTN, and back surgery.  Clinical Impression  Pt is generally weak and deconditioned from prolonged URI (it started ~1 week prior to hospital admission).  Pt even states several times throughout the evaluation that she felt weaker than usual.  Daughter available and encouraged to continue to stay with pt after discharge until she feels she is back to her mobility baseline and PT recommending PT f/u at discharge.   PT to follow acutely for deficits listed below.       Follow Up Recommendations Home health PT;Supervision/Assistance - 24 hour (24/7 supervision for first few days at home)    Equipment Recommendations  None recommended by PT    Recommendations for Other Services   NA    Precautions / Restrictions Precautions Precautions: Fall Precaution Comments: mildly unsteady on her feet      Mobility  Bed Mobility Overal bed mobility: Needs Assistance Bed Mobility: Supine to Sit     Supine to sit: Supervision;HOB elevated     General bed mobility comments: supervision for safety, extra time taken and pt using bed rail for leverage of trunk.   Transfers Overall transfer level: Needs assistance Equipment used: Rolling walker (2 wheeled) Transfers: Sit to/from Stand Sit to Stand: Min guard         General transfer comment: Min guard assist for safety and RW stability.  Verbal cues for safe hand placement.   Ambulation/Gait Ambulation/Gait assistance: Min assist Ambulation Distance (Feet): 55  Feet Assistive device: Rolling walker (2 wheeled) Gait Pattern/deviations: Step-through pattern;Shuffle Gait velocity: decreased   General Gait Details: Pt having difficulty maneuvering regular RW as she is used to the easier to swivel rollator.  Min assist to support trunk for balance and help pt steer RW in tighter space.        Balance Overall balance assessment: Needs assistance Sitting-balance support: Feet supported;No upper extremity supported Sitting balance-Leahy Scale: Good     Standing balance support: Single extremity supported;Bilateral upper extremity supported;No upper extremity supported Standing balance-Leahy Scale: Poor Standing balance comment: needs external assist in standing due to self reporting feeling weak and uses external support at baseline.                              Pertinent Vitals/Pain Pain Assessment: Faces Faces Pain Scale: Hurts a little bit Pain Location: left elbow, old IV access point.  Pain Descriptors / Indicators: Grimacing Pain Intervention(s): Monitored during session    10/21/14 1459  Vital Signs  Pulse Rate 76  Oxygen Therapy  SpO2 94 % (DOE 2/4 during gait, improved when in room with mask off, rattling cough throughout)  O2 Device Room Air    Home Living Family/patient expects to be discharged to:: Private residence Living Arrangements: Alone Available Help at Discharge: Family;Available 24 hours/day (daughter) Type of Home: House Home Access: Stairs to enter Entrance Stairs-Rails: None Entrance Stairs-Number of Steps: 1 (front door, per family if she goes through the garage, none) Home Layout: One level Home  Equipment: Dan Humphreys - 2 wheels;Walker - 4 wheels      Prior Function Level of Independence: Independent with assistive device(s)         Comments: Pt uses rollator for in home mobility because she can set her food and other items she is carrying around the house on it.  She uses a lighter RW when she is  out and about.  Daughters drive her now, and bring her food.  She has a cleaning person who cleans her home.         Extremity/Trunk Assessment   Upper Extremity Assessment: Defer to OT evaluation           Lower Extremity Assessment: Generalized weakness;RLE deficits/detail;LLE deficits/detail RLE Deficits / Details: 4/5 throughout R leg ankle DF, PF, knee extension, hip flexion. LLE Deficits / Details: 4/5 throughout DF/PF, knee extension, hip flexion.  Pt self reporting that left leg is the one that has radicular symptoms due to spinal stenosis including intermittent numbness and pain, but no buckling or weakness.    Cervical / Trunk Assessment: Other exceptions  Communication   Communication: HOH (mildly)  Cognition Arousal/Alertness: Awake/alert Behavior During Therapy: WFL for tasks assessed/performed Overall Cognitive Status: Within Functional Limits for tasks assessed                      General Comments General comments (skin integrity, edema, etc.): O2 sats and HR WNL during mobility.  HEP handout given of below listed exercises and pt (with daughter present) encouraged to do them TID.     Exercises General Exercises - Lower Extremity Long Arc Quad: AROM;Both;10 reps;Seated Hip ABduction/ADduction: AROM;Both;10 reps;Seated (adduct only against blanket for resistance. ) Hip Flexion/Marching: AROM;Both;10 reps;Seated Toe Raises: AROM;Both;10 reps;Seated Heel Raises: AROM;Both;10 reps;Seated      Assessment/Plan    PT Assessment Patient needs continued PT services  PT Diagnosis Difficulty walking;Abnormality of gait;Generalized weakness;Acute pain   PT Problem List Decreased strength;Decreased activity tolerance;Decreased balance;Decreased mobility;Cardiopulmonary status limiting activity;Pain  PT Treatment Interventions DME instruction;Gait training;Functional mobility training;Therapeutic activities;Therapeutic exercise;Balance training;Neuromuscular  re-education;Patient/family education   PT Goals (Current goals can be found in the Care Plan section) Acute Rehab PT Goals Patient Stated Goal: to get stronger and go home PT Goal Formulation: With patient/family Time For Goal Achievement: 11/04/14 Potential to Achieve Goals: Good    Frequency Min 3X/week    End of Session Equipment Utilized During Treatment: Gait belt Activity Tolerance: Patient limited by fatigue Patient left: in chair;with call bell/phone within reach;with family/visitor present Nurse Communication: Mobility status;Other (comment) (negative flu cultures, per RN droplet can be removed)         Time: 2956-2130 PT Time Calculation (min) (ACUTE ONLY): 34 min   Charges:   PT Evaluation $Initial PT Evaluation Tier I: 1 Procedure PT Treatments $Gait Training: 8-22 mins        Jalyne Brodzinski B. Lourdes Manning, PT, DPT [phone]   10/21/2014, 3:42 PM

## 2014-10-21 NOTE — Progress Notes (Signed)
Patient granddaughter complaining of patient not being able to sleep, talking out of her head, and cough;  requesting she have a Xanax.  On assess, patient answered questions appropriately with granddaughter interjecting numerous times.    Informed her that she had a dose at the end of last shift and was not scheduled for another until 10 am.  Stated this was unacceptable.  Rapid response nurse contacted and MD made aware of situation with patient being informed of contact.  Order written for Crisoforo Oxfordessalon Pearl for cough.  Will continue to monitor.

## 2014-10-21 NOTE — Progress Notes (Signed)
Patient ID: Janet CravenLucille C Fridman, female   DOB: November 25, 1920, 81 y.o.   MRN: [id number] [facility] PROGRESS NOTE  Janet Brooks WUJ:[id number] DOB: November 25, 1920 DOA: 10/20/2014 PCP: [name],[name], MD  Brief narrative:    81 y.o. female, with a past medical history of CVA, hypertension, anxiety and spinal stenosis for which she sees a chronic pain management doctor (Dr. [facility]ear [facility]ill[facility] [place] [facility]). Patient's family reported pt developed wheezing and has seen PCP few days PTA, given inhaler, prednisone and a Z-Pak with no significant symptomatic improvement.  On admission, pt developed chest pain in mid sternal area, non radiating and has spontaneously resolved. Further, she was found to have hyponatremia with sodium level of 122. Chest x-ray was negative for opacity or infiltrate. EKG was reported to show new T-wave inversions. Troponin was within normal limits at 0.06. TR[facility] asked to admit for further evaluation and management of hyponitremia, COPD.  Assessment/Plan:    Principal Problem:   Acute respiratory failure with hypoxia / COPD exacerbation - Respiratory status stable at this time - Hypoxia likely sue to COPD exacerbation - Continue oxygen support vai [place] to keep O2 saturation above 90% - Continue solumedrol 40 mg IV Q 24 hours instead of Q 12 hours - Continue duoneb every 6 hours scheduled and every 4 hours as needed for shortness of breath or wheezing  - Continue doxycycline for possible pneumonia considering pt with chronic COPD - Continue antitussives   Active Problems:   Atypical chest pain / mild troponin elevation - CP now resolved; mild troponin elevation likely due to demand ischemia from hypoxia and COPD - Troponin trend 0.06 --> 0.05 --> 0.04; no need to cycle cardiac enzymes further     Hyponatremia - Secondary to dehydration and hctz - Hctz on hold - Follow up BMP in am     Anxiety / insomnia - Continue Lexapro, ambien    Spinal stenosis with chronic  pain - Continue gabapentin.     Essential hypertension  - Hold HCTZ. Continue Lisinopril.    Dysphagia to pills  - Follow up speech and swallow evaluation    DVT Prophylaxis  - SCD's bilaterally    Code Status: DNR/DNI Family Communication:  plan of care discussed with the patient's family at the bedside  Disposition Plan: will recheck BMP in am and if sodium improves may likely go home   IV access:  Peripheral IV  Procedures and diagnostic studies:    Dg Chest 2 View 10/20/2014  No evidence of acute cardiopulmonary disease.   Medical Consultants:  None   Other Consultants:  Physical therapy  IAnti-Infectives:   Doxycycline 10/20/2014 -->   Manson PasseyEVINE, Orilla Templeman, MD  [facility] Pager [phone]  If 7PM-7AM, please contact night-coverage [email or web address] Password [email or web address] 10/21/2014, 5:20 PM   LOS: 1 day    HPI/Subjective: No acute overnight events.  Objective: Filed Vitals:   10/21/14 1130 10/21/14 1445 10/21/14 1459 10/21/14 1524  BP:  147/78    Pulse:  64 76   Temp:  97.8 F (36.6 C)    TempSrc:  Oral    Resp:      Height:      Weight:      SpO2: 91% 96% 94% 92%    Intake/Output Summary (Last 24 hours) at 10/21/14 1720 Last data filed at 10/21/14 1607  Gross per 24 hour  Intake 1678.75 ml  Output   1000 ml  Net 678.75 ml    Exam:   General:  Pt is  alert, not in acute distress  Cardiovascular: Regular rate and rhythm, S1/S2 (+)  Respiratory: diminished breath sounds, no wheezing   Abdomen: Soft, non tender, non distended, bowel sounds present  Extremities: pulses DP and PT palpable bilaterally  Neuro: Grossly nonfocal  Data Reviewed: Basic Metabolic Panel:  Recent Labs Lab 10/20/14 1205 10/20/14 2006 10/21/14 0318  NA 122*  --  122*  K 4.2  --  3.6  CL 87*  --  90*  CO2 23  --  26  GLUCOSE 102*  --  120*  BUN 22  --  16  CREATININE 0.68  --  0.65  CALCIUM 9.3  --  8.7  MG  --  1.5  --    Liver Function Tests: No results  for input(s): AST, ALT, ALKPHOS, BILITOT, PROT, ALBUMIN in the last 168 hours. No results for input(s): LIPASE, AMYLASE in the last 168 hours. No results for input(s): AMMONIA in the last 168 hours. CBC:  Recent Labs Lab 10/20/14 1205 10/21/14 0318  WBC 11.0* 6.6  NEUTROABS 7.6  --   HGB 13.3 12.8  HCT 38.7 36.9  MCV 87.2 85.8  PLT 258 209   Cardiac Enzymes:  Recent Labs Lab 10/20/14 1640 10/20/14 2152 10/21/14 0318  TROPONINI 0.05* 0.04* 0.04*   BNP: Invalid input(s): POCBNP CBG: No results for input(s): GLUCAP in the last 168 hours.  No results found for this or any previous visit (from the past 240 hour(s)).   Scheduled Meds: . docusate sodium  100 mg Oral BID  . dorzolamide  1 drop Both Eyes Q12H  . doxycycline   100 mg Intravenous Q12H  . escitalopram  10 mg Oral Daily  . gabapentin  300 mg Oral TID  . guaiFENesin  1,200 mg Oral BID  . ipratropium-albuterol  3 mL Nebulization QID  . lisinopril  20 mg Oral Daily  . methylPREDNISolone   40 mg Intravenous Q12H  . zolpidem  10 mg Oral QHS

## 2014-10-21 NOTE — Evaluation (Signed)
Occupational Therapy Evaluation Patient Details Name: Janet CravenLucille C Brooks MRN: [id number] DOB: January 02, 1921 Today's Date: 10/21/2014    History of Present Illness 81 y.o. female [facility] 10/20/14 for SOB and chest pain.  Medical and cardiac workup in progress.  Dx with acute respiratory failure secondary to acute viral bronchitis.  Pt is (-) for flu.  Significant past medical history includes: glaucoma, spinal stenosis (per pt report with L leg radicular symptoms), HTN, and back surgery.   Clinical Impression    Pt admitted with above.  She is able to perform BADLs at supervision level.  Requires min guard assist to maneuver standard RW in small spaces such as the bathroom (she uses a rollator at home which is easier to maneuver).   She reports she is tired, but attributes most of that to inability to sleep last night.  Her daughter will be providing 24 hour supervision at discharge.  Pt does not need skilled OT at this time.  Recommended to she and daughter that pt start performing BADLs while in hospital (with supervision), and to ambulate with nsg staff.  They verbalized understanding.   Follow Up Recommendations  No OT follow up;Supervision/Assistance - 24 hour    Equipment Recommendations  None recommended by OT    Recommendations for Other Services       Precautions / Restrictions Precautions Precautions: Fall Precaution Comments: mildly unsteady on her feet      Mobility Bed Mobility Overal bed mobility: Needs Assistance Bed Mobility: Supine to Sit     Supine to sit: Supervision;HOB elevated     General bed mobility comments: supervision for safety, extra time taken and pt using bed rail for leverage of trunk.   Transfers Overall transfer level: Needs assistance Equipment used: Rolling walker (2 wheeled) Transfers: Sit to/from [employer] CorporationStand;Stand Pivot Transfers Sit to Stand: Supervision Stand pivot transfers: Min guard       General transfer comment:  min guard to [employer] Companymaneurver walker in small spaces     Balance Overall balance assessment: Needs assistance Sitting-balance support: Feet supported Sitting balance-Leahy Scale: Good     Standing balance support: During functional activity Standing balance-Leahy Scale: Poor Standing balance comment: reliant on UE support.  Able to stand with unilateral UE support with supervision                             ADL Overall ADL's : Needs assistance/impaired Eating/Feeding: Independent;Sitting   Grooming: Wash/dry hands;Wash/dry face;Oral care;Brushing hair;Supervision/safety;Standing   Upper Body Bathing: Set up;Sitting   Lower Body Bathing: Supervison/ safety;Sit to/from stand   Upper Body Dressing : Set up;Sitting   Lower Body Dressing: Supervision/safety;Sit to/from stand   Toilet Transfer: Min guard;Ambulation;Comfort height toilet;RW;Grab bars   Toileting- Clothing Manipulation and Hygiene: Supervision/safety;Sit to/from stand       Functional mobility during ADLs: Supervision/safety;Min guard;Rolling walker General ADL Comments: Pt requires min guard assist to maneuver RW in tight spaces such as the bathroom, she required supervision in the rest of the room.  She is used to using rollator at home which is easier to maneuver (per her and daughter's report).   Pt reports she feels tired.      Vision     Perception     Praxis      Pertinent Vitals/Pain Pain Assessment: No/denies pain Faces Pain Scale: Hurts a little bit Pain Location: left elbow, old IV access point.  Pain Descriptors / Indicators: Grimacing  Pain Intervention(s): Monitored during session     Hand Dominance     Extremity/Trunk Assessment Upper Extremity Assessment Upper Extremity Assessment: Generalized weakness   Lower Extremity Assessment Lower Extremity Assessment: Defer to PT evaluation RLE Deficits / Details: 4/5 throughout R leg ankle DF, PF, knee extension, hip flexion. LLE  Deficits / Details: 4/5 throughout DF/PF, knee extension, hip flexion.  Pt self reporting that left leg is the one that has radicular symptoms due to spinal stenosis including intermittent numbness and pain, but no buckling or weakness.     Cervical / Trunk Assessment Cervical / Trunk Assessment: Other exceptions Cervical / Trunk Exceptions: h/o spinal surgery per chart.   Communication Communication Communication: HOH (mildly)   Cognition Arousal/Alertness: Awake/alert Behavior During Therapy: WFL for tasks assessed/performed Overall Cognitive Status: Within Functional Limits for tasks assessed                     General Comments       Exercises Exercises: General Lower Extremity     Shoulder Instructions      Home Living Family/patient expects to be discharged to:: Private residence Living Arrangements: Alone Available Help at Discharge: Family;Available 24 hours/day (daughter) Type of Home: House Home Access: Stairs to enter [employer] of Steps: 1 (front door, per family if she goes through the garage, none) Entrance Stairs-Rails: None Home Layout: One level     Bathroom Shower/Tub: Producer, television/film/video: Standard Bathroom Accessibility: Yes How Accessible: Accessible via walker Home Equipment: Walker - 2 wheels;Walker - 4 wheels;Shower seat          Prior Functioning/Environment Level of Independence: Independent with assistive device(s)        Comments: Pt uses rollator for in home mobility because she can set her food and other items she is carrying around the house on it.  She uses a lighter RW when she is out and about.  Daughters drive her now, and bring her food.  She has a cleaning person who cleans her home.     OT Diagnosis: Generalized weakness   OT Problem List: Decreased strength;Decreased activity tolerance   OT Treatment/Interventions:      OT Goals(Current goals can be found in the care plan section) Acute  Rehab OT Goals Patient Stated Goal: to go home  OT Goal Formulation: All assessment and education complete, DC therapy  OT Frequency:     Barriers to D/C:            Co-evaluation              End of Session Equipment Utilized During Treatment: Rolling walker Nurse Communication: Mobility status  Activity Tolerance: Patient tolerated treatment well Patient left: in chair;with call bell/phone within reach;with family/visitor present   Time: 8119-1478 OT Time Calculation (min): 17 min Charges:  OT General Charges $OT Visit: 1 Procedure OT Evaluation $Initial OT Evaluation Tier I: 1 Procedure G-Codes:    Jeani Hawking M 10-29-2014, 4:04 PM

## 2014-10-22 LAB — BASIC METABOLIC PANEL
Anion gap: 8 (ref 5–15)
BUN: 16 mg/dL (ref 6–23)
CO2: 23 mmol/L (ref 19–32)
Calcium: 8.5 mg/dL (ref 8.4–10.5)
Chloride: 97 mmol/L (ref 96–112)
Creatinine, Ser: 0.67 mg/dL (ref 0.50–1.10)
GFR calc Af Amer: 86 mL/min — ABNORMAL LOW (ref >90)
GFR, EST NON AFRICAN AMERICAN: 75 mL/min — AB (ref >90)
GLUCOSE: 86 mg/dL (ref 70–99)
Potassium: 3.1 mmol/L — ABNORMAL LOW (ref 3.5–5.1)
Sodium: 128 mmol/L — ABNORMAL LOW (ref 135–145)

## 2014-10-22 LAB — URINE CULTURE

## 2014-10-22 MED ORDER — POTASSIUM CHLORIDE CRYS ER 20 MEQ PO TBCR
40.0000 meq | EXTENDED_RELEASE_TABLET | Freq: Once | ORAL | Status: AC
Start: 2014-10-22 — End: 2014-10-22
  Administered 2014-10-22: 40 meq via ORAL
  Filled 2014-10-22: qty 2

## 2014-10-22 NOTE — Progress Notes (Signed)
Patient ID: Janet Brooks, female   DOB: 1920-12-01, 81 y.o.   MRN: [id number] [facility] PROGRESS NOTE  Janet Brooks WUJ:[id number] DOB: 08-09-1920 DOA: 10/20/2014 PCP: Mickie Hillier, MD  Brief narrative:    81 y.o. female, with a past medical history of CVA, hypertension, anxiety and spinal stenosis for which she sees a chronic pain management doctor (Dr. [name] [facility]). Patient's family reported pt developed wheezing and has seen PCP few days PTA, given inhaler, prednisone and a Z-Pak with no significant symptomatic improvement.   On admission, pt developed chest pain in mid sternal area, non radiating and has spontaneously resolved. Further, she was found to have hyponatremia with sodium level of 122. Chest x-ray was negative for opacity or infiltrate. EKG was reported to show new T-wave inversions. Troponin was slightly elevated at 0.06. TRH asked to admit for further evaluation and management of hyponitremia, COPD.  Assessment/Plan:    Principal Problem:   Acute respiratory failure with hypoxia / COPD exacerbation - Respiratory status stable at this time but still mild exp wheezing  - Continue oxygen support vai [place] to keep O2 saturation above 90% - Continue solumedrol 40 mg IV Q 24 hours with no planned tapering yet due to persistent wheezing  - Continue duoneb every 6 hours scheduled and every 4 hours as needed for shortness of breath or wheezing  - Continue empiric doxycycline considering pt with chronic COPD - Continue antitussives   Active Problems:   Atypical chest pain / mild troponin elevation - CP now resolved; mild troponin elevation likely due to demand ischemia from hypoxia and COPD - Troponin trend 0.06 --> 0.05 --> 0.04; no need to cycle cardiac enzymes further as pt doing better and no CP this AM - provide analgesia as needed     Hyponatremia - Secondary to dehydration and hctz - Hctz on hold, IVF have been provided and Na is  trending up, 128 this AM - Follow up BMP in am     Anxiety / insomnia - Continue Lexapro, ambien    Spinal stenosis with chronic pain - Continue gabapentin.     Essential hypertension  - Hold HCTZ. Continue Lisinopril.     Hypokalemia - supplement and repeat BMP in AM    Dysphagia to pills  - Follow up speech and swallow evaluation     Non severe PCM - in the context of acute illness  - nutritionist consulted    DVT Prophylaxis  - SCD's bilaterally   Code Status: DNR/DNI Family Communication:  plan of care discussed with the patient's daughter at bedside  Disposition Plan: possible d/c home in 24 - 48 hours once wheezing is resolved   IV access:  Peripheral IV  Procedures and diagnostic studies:    Dg Chest 2 View 10/20/2014  No evidence of acute cardiopulmonary disease.   Medical Consultants:  None   Other Consultants:  Physical therapy Respiratory therapy   IAnti-Infectives:   Doxycycline 10/20/2014 -->  Debbora Presto, MD  [facility] Pager [phone]  If 7PM-7AM, please contact night-coverage [email or web address] Password [email or web address] 10/22/2014, 2:04 PM   LOS: 2 days    HPI/Subjective: No acute overnight events.  Objective: Filed Vitals:   10/21/14 2105 10/22/14 0507 10/22/14 0903 10/22/14 1059  BP: 128/56 138/55  140/56  Pulse: 65 85    Temp: 98.3 F (36.8 C) 97.9 F (36.6 C)    TempSrc: Oral Oral    Resp: 15 15    Height:  Weight:      SpO2: 96% 95% 95%     Intake/Output Summary (Last 24 hours) at 10/22/14 1404 Last data filed at 10/22/14 1314  Gross per 24 hour  Intake    480 ml  Output   1825 ml  Net  -1345 ml    Exam:   General:  Pt is alert, not in acute distress  Cardiovascular: Regular rate and rhythm, S1/S2 (+)  Respiratory: still rhonchi bilaterally with expiratory wheezing   Abdomen: Soft, non tender, non distended, bowel sounds present  Extremities: pulses DP and PT palpable bilaterally  Neuro: Grossly  nonfocal  Data Reviewed: Basic Metabolic Panel:  Recent Labs Lab 10/20/14 1205 10/20/14 2006 10/21/14 0318 10/22/14 0640  NA 122*  --  122* 128*  K 4.2  --  3.6 3.1*  CL 87*  --  90* 97  CO2 23  --  26 23  GLUCOSE 102*  --  120* 86  BUN 22  --  16 16  CREATININE 0.68  --  0.65 0.67  CALCIUM 9.3  --  8.7 8.5  MG  --  1.5  --   --    CBC:  Recent Labs Lab 10/20/14 1205 10/21/14 0318  WBC 11.0* 6.6  NEUTROABS 7.6  --   HGB 13.3 12.8  HCT 38.7 36.9  MCV 87.2 85.8  PLT 258 209   Cardiac Enzymes:  Recent Labs Lab 10/20/14 1640 10/20/14 2152 10/21/14 0318  TROPONINI 0.05* 0.04* 0.04*   Recent Results (from the past 240 hour(s))  Urine culture     Status: None   Collection Time: 10/20/14  9:09 PM  Result Value Ref Range Status   Specimen Description URINE, CLEAN CATCH  Final   Special Requests NONE  Final   Colony Count   Final    50,000 COLONIES/ML Performed at [facility]    Culture   Final    Multiple bacterial morphotypes present, none predominant. Suggest appropriate recollection if clinically indicated. Performed at [facility]    Report Status 10/22/2014 FINAL  Final     Scheduled Meds: . docusate sodium  100 mg Oral BID  . dorzolamide  1 drop Both Eyes Q12H  . doxycycline  IV  100 mg Intravenous Q12H  . escitalopram  10 mg Oral Daily  . gabapentin  300 mg Oral TID  . guaiFENesin  1,200 mg Oral BID  . ipratropium-albuterol  3 mL Nebulization Q6H  . lisinopril  20 mg Oral Daily  . methylPREDNISolone   40 mg Intravenous Q24H  . zolpidem  10 mg Oral QHS

## 2014-10-23 ENCOUNTER — Inpatient Hospital Stay (HOSPITAL_COMMUNITY): Payer: Medicare Other

## 2014-10-23 DIAGNOSIS — E876 Hypokalemia: Secondary | ICD-10-CM

## 2014-10-23 DIAGNOSIS — J441 Chronic obstructive pulmonary disease with (acute) exacerbation: Principal | ICD-10-CM

## 2014-10-23 LAB — CBC
HCT: 39.7 % (ref 36.0–46.0)
HEMOGLOBIN: 13.4 g/dL (ref 12.0–15.0)
MCH: 30.3 pg (ref 26.0–34.0)
MCHC: 33.8 g/dL (ref 30.0–36.0)
MCV: 89.8 fL (ref 78.0–100.0)
Platelets: 285 10*3/uL (ref 150–400)
RBC: 4.42 MIL/uL (ref 3.87–5.11)
RDW: 13.1 % (ref 11.5–15.5)
WBC: 9.9 10*3/uL (ref 4.0–10.5)

## 2014-10-23 LAB — BASIC METABOLIC PANEL
Anion gap: 6 (ref 5–15)
BUN: 15 mg/dL (ref 6–23)
CHLORIDE: 103 mmol/L (ref 96–112)
CO2: 26 mmol/L (ref 19–32)
Calcium: 8.8 mg/dL (ref 8.4–10.5)
Creatinine, Ser: 0.75 mg/dL (ref 0.50–1.10)
GFR calc Af Amer: 83 mL/min — ABNORMAL LOW (ref >90)
GFR calc non Af Amer: 72 mL/min — ABNORMAL LOW (ref >90)
GLUCOSE: 84 mg/dL (ref 70–99)
POTASSIUM: 3.5 mmol/L (ref 3.5–5.1)
SODIUM: 135 mmol/L (ref 135–145)

## 2014-10-23 MED ORDER — DOXYCYCLINE HYCLATE 100 MG PO TABS
100.0000 mg | ORAL_TABLET | Freq: Two times a day (BID) | ORAL | Status: AC
  Administered 2014-10-23 – 2014-10-25 (×4): 100 mg via ORAL
  Filled 2014-10-23 (×5): qty 1

## 2014-10-23 MED ORDER — PREDNISONE 50 MG PO TABS
50.0000 mg | ORAL_TABLET | Freq: Every day | ORAL | Status: DC
  Administered 2014-10-24 – 2014-10-25 (×2): 50 mg via ORAL
  Filled 2014-10-23 (×3): qty 1

## 2014-10-23 MED ORDER — POTASSIUM CHLORIDE 10 MEQ/100ML IV SOLN: 10.0000 meq | INTRAVENOUS | Status: DC

## 2014-10-23 MED ORDER — IPRATROPIUM-ALBUTEROL 0.5-2.5 (3) MG/3ML IN SOLN
3.0000 mL | Freq: Three times a day (TID) | RESPIRATORY_TRACT | Status: DC
  Administered 2014-10-24 – 2014-10-25 (×4): 3 mL via RESPIRATORY_TRACT
  Filled 2014-10-23 (×4): qty 3

## 2014-10-23 NOTE — Progress Notes (Signed)
Patient ID: Janet Brooks, female   DOB: 06-17-1921, 81 y.o.   MRN: [id number] [facility] PROGRESS NOTE  Janet Brooks:[id number] DOB: July 11, 1921 DOA: 10/20/2014 PCP: Mickie Hillier, MD  Brief narrative:    81 y.o. female, with a past medical history of CVA, hypertension, anxiety and spinal stenosis for which she sees a chronic pain management doctor (Dr. [name] of [facility]). Patient's family reported pt developed wheezing and has seen PCP few days PTA, given inhaler, prednisone and a Z-Pak with no significant symptomatic improvement.   On admission, pt developed chest pain in mid sternal area, non radiating and has spontaneously resolved. Further, she was found to have hyponatremia with sodium level of 122. Chest x-ray was negative for opacity or infiltrate. EKG was reported to show new T-wave inversions. Troponin was slightly elevated at 0.06. [facility] asked to admit for further evaluation and management of hyponatremia, COPD.  Hospital course slightly complicated with reports of dysphagia esophagogram with showed 3 areas of potential stricturing in the esophagus. I spoke with [facility] who reviewed the findings and recommended trying diet per SLP eval, dysphagia 2 and if aspirating to call for an official consult.   Assessment/Plan:    Principal Problem:  Acute respiratory failure with hypoxia / COPD exacerbation - Respiratory status stable - Continue oxygen support vai [place] to keep O2 saturation above 90% - Taper steroids, start prednisone 50 mg PO from tomorrow (now on IV solu-medrol); taper on discharge.  - Continue duoneb every 6 hours scheduled and every 4 hours as needed for shortness of breath or wheezing  - Continue empiric doxycycline considering pt with chronic COPD.   Active Problems:  Atypical chest pain / mild troponin elevation - Mild troponin elevation likely due to demand ischemia from hypoxia and COPD - Troponin trend 0.06 --> 0.05 --> 0.04; no  need to cycle cardiac enzymes. No c/o chest pain.   Hyponatremia - Secondary to dehydration and hctz. Hctz placed on hold. Sodium now WNL.   Anxiety / insomnia - Continue Lexapro, ambien   Spinal stenosis with chronic pain - Continue gabapentin.    Essential hypertension  - Continue Lisinopril.   Hypokalemia - Likely since pt was on Hctz prior to admission - Supplemented - Potassium WNL   Dysphagia to pills  - SLP eval - dysphagia 2. - ? Strictures on esophagogram done 10/23/2014. Per GI, try with dysphagia 2 and if risk of aspiration then call for an official consult.    Non severe PCM - In the context of acute illness  - Nutritionist consulted - On dysphagia 2 diet now      DVT Prophylaxis  - SCD's bilaterally    Code Status: DNR/DNI.  Family Communication:  Family not at the bedside this am  Disposition Plan: will go home with HHPT once stable. Will monitor if tolerates dysphagia 2 diet   IV access:  Peripheral IV  Procedures and diagnostic studies:    Dg Chest 2 View 10/20/2014  No evidence of acute cardiopulmonary disease.   Electronically Signed   By: Charline Bills M.D.   On: 10/20/2014 13:13   Dg Esophagus 10/23/2014  1. There 3 areas of potential stricturing in the esophagus. A narrowing at the level of the pulmonary veins was not able to be distended beyond about 5 millimeters on swallows, but a 13 millimeter barium tablet made its way pass this narrowing. The tablet impacted above the hiatal hernia, where the maximum distensible diameter of the  esophagus was about 11 millimeters. The tight fist appearing narrowing was below the small type 1 hiatal hernia, which I was only able to distend up to 2-3 millimeters. 2. Nonspecific esophageal dysmotility disorder, with disruption of all primary peristaltic waves in the upper thoracic esophagus, and scattered tertiary contractions. 3. Extensive mobility issues mid that we could only image the patient in the  LPO position. This results in a considerably reduced sensitivity, specificity, and negative predictive value compared to the full esophagram exam, which the patient was not capable of. Endoscopy may be warranted.   Electronically Signed   By: Gaylyn Rong M.D.   On: 10/23/2014 14:34   Medical Consultants:  GI - phone call only ([facility])  Other Consultants:  Physical therapy Respiratory therapy  SLP  IAnti-Infectives:   Doxycycline 10/20/2014 -->   Manson Passey, MD  [facility] Pager [phone]  If 7PM-7AM, please contact night-coverage [email or web address] Password [facility] 10/23/2014, 5:28 PM   LOS: 3 days    HPI/Subjective: No acute overnight events.  Objective: Filed Vitals:   10/23/14 0604 10/23/14 0928 10/23/14 1349 10/23/14 1609  BP: 142/80   155/69  Pulse:    71  Temp: 98.8 F (37.1 C)   98.3 F (36.8 C)  TempSrc: Oral   Oral  Resp: 17   18  Height:      Weight:      SpO2: 92% 94% 96% 94%    Intake/Output Summary (Last 24 hours) at 10/23/14 1728 Last data filed at 10/23/14 1342  Gross per 24 hour  Intake    250 ml  Output   1850 ml  Net  -1600 ml    Exam:   General:  Pt is alert, not in acute distress  Cardiovascular: Regular rate and rhythm, S1/S2 (+)  Respiratory: no wheezing, no crackles, no rhonchi  Abdomen: Soft, non tender, non distended, bowel sounds present  Extremities: No edema, pulses DP and PT palpable bilaterally  Neuro: Grossly nonfocal  Data Reviewed: Basic Metabolic Panel:  Recent Labs Lab 10/20/14 1205 10/20/14 2006 10/21/14 0318 10/22/14 0640 10/23/14 0825  NA 122*  --  122* 128* 135  K 4.2  --  3.6 3.1* 3.5  CL 87*  --  90* 97 103  CO2 23  --  GLUCOSE 102*  --  120* 86 84  BUN 22  --  CREATININE 0.68  --  0.65 0.67 0.75  CALCIUM 9.3  --  8.7 8.5 8.8  MG  --  1.5  --   --   --    Liver Function Tests: No results for input(s): AST, ALT, ALKPHOS, BILITOT, PROT, ALBUMIN in the last 168  hours. No results for input(s): LIPASE, AMYLASE in the last 168 hours. No results for input(s): AMMONIA in the last 168 hours. CBC:  Recent Labs Lab 10/20/14 1205 10/21/14 0318 10/23/14 0825  WBC 11.0* 6.6 9.9  NEUTROABS 7.6  --   --   HGB 13.3 12.8 13.4  HCT 38.7 36.9 39.7  MCV 87.2 85.8 89.8  PLT 258 209 285   Cardiac Enzymes:  Recent Labs Lab 10/20/14 1640 10/20/14 2152 10/21/14 0318  TROPONINI 0.05* 0.04* 0.04*   BNP: Invalid input(s): POCBNP CBG: No results for input(s): GLUCAP in the last 168 hours.  Recent Results (from the past 240 hour(s))  Urine culture     Status: None   Collection Time: 10/20/14  9:09 PM  Result Value Ref Range Status  Specimen Description URINE, CLEAN CATCH  Final   Special Requests NONE  Final   Colony Count   Final   Culture   Final    Multiple bacterial morphotypes present, none predominant. Suggest appropriate recollection if clinically indicated. Performed at [facility]    Report Status 10/22/2014 FINAL  Final     Scheduled Meds: . docusate sodium  100 mg Oral BID  . doxycycline  100 mg Oral Q12H  . escitalopram  10 mg Oral Daily  . gabapentin  300 mg Oral TID  . guaiFENesin  1,200 mg Oral BID  . ipratropium-albuterol  3 mL Nebulization Q6H  . lisinopril  20 mg Oral Daily  . methylPREDNISolone   40 mg Intravenous Q24H  . zolpidem  10 mg Oral QHS

## 2014-10-23 NOTE — Progress Notes (Signed)
Speech Language Pathology Treatment: Dysphagia  Patient Details Name: Janet Brooks MRN: [id number] DOB: Dec 04, 1920 Today's Date: 10/23/2014 Time: [id number] SLP Time Calculation (min) (ACUTE ONLY): 15 min  Assessment / Plan / Recommendation Clinical Impression  Pt now on full liquid diet s/p barium swallow completed earlier today. Report indicated 3 areas of potential stricture as well as esophageal dysmotility. SLP provided skilled observation with thin liquids with no overt signs of aspiration and no subjective report of difficulty from patient. Education was provided to pt and daughter regarding esophageal precautions. Will continue to follow briefly as diet is advanced from a GI standpoint - question if pt is able to tolerate a slightly more solid diet (Dys 1 or 2?) as some pureed consistencies do come with full liquid diet orders.    HPI HPI: 81 y.o. female admitted with acute respiratory distress, COPD exacerbation, anxiety, dysphagia with pills.  No prior swallow studies per record review.     Pertinent Vitals Pain Assessment: No/denies pain  SLP Plan  Continue with current plan of care    Recommendations Diet recommendations: Dysphagia 2 (fine chop);Thin liquid Liquids provided via: Cup;Straw Medication Administration: Crushed with puree Supervision: Patient able to self feed Compensations: Slow rate;Small sips/bites;Follow solids with liquid Postural Changes and/or Swallow Maneuvers: Seated upright 90 degrees;Upright 30-60 min after meal       Oral Care Recommendations: Oral care BID Follow up Recommendations: None Plan: Continue with current plan of care    Janet HamLaura Brooks, M.A. CCC-SLP [phone]  Janet Hamaiewonsky, Janet Brooks 10/23/2014, 4:52 PM

## 2014-10-23 NOTE — Progress Notes (Signed)
PT Cancellation Note  Patient Details Name: Janet CravenLucille C Brooks MRN: [id number] DOB: 12/31/1920   Cancelled Treatment:    Reason Eval/Treat Not Completed: Patient declined, no reason specified.  Pt has been NPO awaiting word on barium swallow results.  Pt doesn't feel up to any activity as a result.  Will try to see pt 3/29 as able. 10/23/2014  [place] BingKen Jonas Goh, PT [phone]  (pager)   [name], [name] 10/23/2014, 2:26 PM

## 2014-10-23 NOTE — Progress Notes (Signed)
INITIAL NUTRITION ASSESSMENT  DOCUMENTATION CODES Per approved criteria  -Not Applicable   INTERVENTION: -Provide high protein snacks TID between meals  NUTRITION DIAGNOSIS: Inadequate oral intake related to dysphagia as evidenced by recent advancement to full liquid diet.   Goal: Pt will meet >90% of estimated nutritional needs  Monitor:  Diet advancement, PO intake, labs, weight changes, I/O's  Reason for Assessment: Consult to assess nutritional needs and status  81 y.o. female  Admitting Dx: Acute on chronic respiratory failure with hypoxia  Alicya Bena is a 81 y.o. female, with a past medical history of CVA, hypertension, anxiety, and spinal stenosis for which she sees a chronic pain management doctor (Dr. [name] of [facility]). Patient is currently receiving a continuous neb so much of history is collected from her daughter at bedside. Her daughter reports that patient developed laryngitis last Saturday, March 19. On Monday she began wheezing. This is very atypical for her as she has no history of asthma or COPD.   ASSESSMENT: Pt admitted with acute respiratory failure.  Hx obtained from pt at bedside. She reports that she has been petite all of her life ("I'm only 4'11"). She reveals her UBW is between 98-102#, which is consistent with wt hx.  She reports good appetite at baseline ("Just ask my daughter- I eat constantly"). She consumes a regular diet at home, but admits the chopping and mincing meats for ease of intake. PTA she was consuming 5 small meals per day, which consisted of a vegetable (salad or green beans), meat, and a starch. Pt also consumes milk, yogurt, and cottage cheese. She was able to identify sources of protein in her diet. She is very anxious to start eating again. Per RN, pt was just advanced to a full liquid diet.  BSE completed on 10/20/14 recommended a dysphagia 3 diet with thin liquids. Pt is awaiting results of MBSS. Pt reports that  she was diagnosed with spinal stenosis, which decreased her mobility. She reports she uses a walker at baseline for balance. She reveals she was driving up until a few months ago. She prides herself on being very independent for her age. She has very good support from her daughter. Suspect fat and muscle depletion is due to advanced age and decreased mobility.  Discussed importance of nutritional adequacy to promote healing process. Encouraged pt to consume high protein foods first. She declines offer of nutrition supplements; she reveals that she uses them intermittently when she is unable to eat a meal at home. Pt expresses gratefulness for visit.  Labs reviewed. WDL.   Nutrition Focused Physical Exam:  Subcutaneous Fat:  Orbital Region: severe depletion Upper Arm Region: moderate depletion Thoracic and Lumbar Region: mild depletion  Muscle:  Temple Region: severe depletion Clavicle Bone Region: severe depletion Clavicle and Acromion Bone Region: moderate depletion Scapular Bone Region: moderate depletion Dorsal Hand: moderate depletion Patellar Region: moderate depletion Anterior Thigh Region: moderate depletion Posterior Calf Region: moderate depletion  Edema: none present    Height: Ht Readings from Last 1 Encounters:  10/20/14  (1.499 m)    Weight: Wt Readings from Last 1 Encounters:  10/21/14 99 lb 3.3 oz (45 kg)    Ideal Body Weight: 98#  % Ideal Body Weight: 101%  Wt Readings from Last 10 Encounters:  10/21/14 99 lb 3.3 oz (45 kg)  03/30/14 102 lb 8.2 oz (46.5 kg)  02/02/13 104 lb (47.174 kg)  11/24/12 109 lb (49.442 kg)  11/21/12 102 lb (46.267 kg)  Usual Body Weight: 102#  % Usual Body Weight: 97%  BMI:  Body mass index is 20.03 kg/(m^2). Normal weight range  Estimated Nutritional Needs: Kcal: 1200-1400 Protein: 55-65 grams Fluid: 1.2-1.4 L  Skin: WDL  Diet Order: Diet full liquid Room service appropriate?: Yes; Fluid consistency::  Thin  EDUCATION NEEDS: -Education needs addressed   Intake/Output Summary (Last 24 hours) at 10/23/14 1500 Last data filed at 10/23/14 1342  Gross per 24 hour  Intake    250 ml  Output   2250 ml  Net  -2000 ml    Last BM: 10/22/14  Labs:   Recent Labs Lab 10/20/14 2006 10/21/14 0318 10/22/14 0640 10/23/14 0825  NA  --  122* 128* 135  K  --  3.6 3.1* 3.5  CL  --  90* 97 103  CO2  --  26 23 26   BUN  --  16 16 15   CREATININE  --  0.65 0.67 0.75  CALCIUM  --  8.7 8.5 8.8  MG 1.5  --   --   --   GLUCOSE  --  120* 86 84    CBG (last 3)  No results for input(s): GLUCAP in the last 72 hours.  Scheduled Meds: . docusate sodium  100 mg Oral BID  . dorzolamide  1 drop Both Eyes Q12H  . doxycycline  100 mg Oral Q12H  . escitalopram  10 mg Oral Daily  . gabapentin  300 mg Oral TID  . guaiFENesin  1,200 mg Oral BID  . ipratropium-albuterol  3 mL Nebulization Q6H  . lisinopril  20 mg Oral Daily  . methylPREDNISolone (SOLU-MEDROL) injection  40 mg Intravenous Q24H  . pneumococcal 23 valent vaccine  0.5 mL Intramuscular Tomorrow-1000  . sodium chloride  3 mL Intravenous Q12H  . timolol  1 drop Both Eyes Q12H  . zolpidem  10 mg Oral QHS    Continuous Infusions:   Past Medical History  Diagnosis Date  . Glaucoma   . Spinal stenosis   . Hypertension     Past Surgical History  Procedure Laterality Date  . Partial hysterectomy    . Back surgery      Kitzia Camus A. Mayford KnifeWilliams, RD, LDN, CDE Pager: [phone] After hours Pager: [phone]

## 2014-10-23 NOTE — Progress Notes (Signed)
SLP Cancellation Note  Patient Details Name: Janet Brooks MRN: [id number] DOB: [birth year]   Cancelled treatment:       Reason Eval/Treat Not Completed: Patient at procedure or test/unavailable   Metro Kungleksiak, Amy K, MA, CCC-SLP 10/23/2014, 3:02 PM

## 2014-10-23 NOTE — Progress Notes (Signed)
Utilization review completed. Kamira Mellette, RN, BSN. 

## 2014-10-24 MED ORDER — PANTOPRAZOLE SODIUM 40 MG IV SOLR
40.0000 mg | Freq: Two times a day (BID) | INTRAVENOUS | Status: DC
  Administered 2014-10-24 – 2014-10-25 (×2): 40 mg via INTRAVENOUS
  Filled 2014-10-24 (×3): qty 40

## 2014-10-24 MED ORDER — GUAIFENESIN 100 MG/5ML PO SYRP
200.0000 mg | ORAL_SOLUTION | Freq: Four times a day (QID) | ORAL | Status: DC
Start: 2014-10-24 — End: 2014-10-25
  Administered 2014-10-24 – 2014-10-25 (×3): 200 mg via ORAL
  Filled 2014-10-24 (×5): qty 10

## 2014-10-24 NOTE — Progress Notes (Addendum)
Patient ID: Janet Brooks, female   DOB: 1921-04-08, 81 y.o.   MRN: [id number] [facility] PROGRESS NOTE  Janet Brooks OZH:[id number] DOB: 1921-04-08 DOA: 10/20/2014 PCP: [name],[name], MD  Brief narrative:    81 y.o. female, with a past medical history of CVA, hypertension, anxiety and spinal stenosis for which she sees a chronic pain management doctor (Dr. [facility]ear [facility] [place] [facility]). Patient's family reported pt developed wheezing and has seen PCP few days PTA, given inhaler, prednisone and a Z-Pak with no significant symptomatic improvement.   On admission, pt developed chest pain in mid sternal area, non radiating and has spontaneously resolved. Further, she was found to have hyponatremia with sodium level of 122. Chest x-ray was negative for opacity or infiltrate. EKG was reported to show new T-wave inversions. Troponin was slightly elevated at 0.06. [facility] asked to admit for further evaluation and management of hyponatremia, COPD.  Hospital course slightly complicated with reports of dysphagia esophagogram with showed 3 areas of potential stricturing in the esophagus. I spoke with [facility] who reviewed the findings and recommended trying diet per SLP eval, dysphagia 2 and if aspirating to call for an official consult.   Assessment/Plan:    Principal Problem:  Acute respiratory failure with hypoxia / COPD exacerbation - Respiratory status stable - Continue oxygen support via [place] to keep O2 saturation above 90% - Taper steroids, on prednisone 50 mg Per/day  taper on discharge.  - Continue duoneb every 6 hours scheduled and every 4 hours as needed for shortness of breath or wheezing  - Continue empiric doxycycline considering pt with chronic COPD.     Atypical chest pain / mild troponin elevation - Mild troponin elevation likely due to demand ischemia from hypoxia and COPD - Troponin trend 0.06 --> 0.05 --> 0.04; no need to cycle cardiac enzymes. No c/o chest pain.    Hyponatremia - Secondary to dehydration and hctz. Hctz placed on hold. Sodium now WNL.   Anxiety / insomnia - Continue Lexapro, ambien   Spinal stenosis with chronic pain - Continue gabapentin.    Essential hypertension  - Continue Lisinopril.   Hypokalemia - Likely since pt was on Hctz prior to admission - Supplemented - Potassium WNL   Dysphagia to pills  - SLP eval - dysphagia 2.Called [facility] , dr Madilyn FiremanHayes on call, stable from pulmonary standpoint to have EGD if needed  - ? Strictures on esophagogram done 10/23/2014.  Started on a PPI BID     Non severe PCM - In the context of acute illness  - Nutritionist consulted - On dysphagia 2 diet now      DVT Prophylaxis  - SCD's bilaterally     Code Status: DNR/DNI.  Family Communication:  Family not at the bedside this am  Disposition Plan: will go home with HHPT once stable.   IV access:  Peripheral IV  Procedures and diagnostic studies:    Dg Chest 2 View 10/20/2014  No evidence of acute cardiopulmonary disease.   Electronically Signed   By: Charline BillsSriyesh  Krishnan M.D.   On: 10/20/2014 13:13   Dg Esophagus 10/23/2014  1. There 3 areas of potential stricturing in the esophagus. A narrowing at the level of the pulmonary veins was not able to be distended beyond about 5 millimeters on swallows, but a 13 millimeter barium tablet made its way pass this narrowing. The tablet impacted above the hiatal hernia, where the maximum distensible diameter of the esophagus was about 11 millimeters.  The tight fist appearing narrowing was below the small type 1 hiatal hernia, which I was only able to distend up to 2-3 millimeters. 2. Nonspecific esophageal dysmotility disorder, with disruption of all primary peristaltic waves in the upper thoracic esophagus, and scattered tertiary contractions. 3. Extensive mobility issues mid that we could only image the patient in the LPO position. This results in a considerably reduced sensitivity,  specificity, and negative predictive value compared to the full esophagram exam, which the patient was not capable of. Endoscopy may be warranted.   Electronically Signed   By: Gaylyn Rong M.D.   On: 10/23/2014 14:34   Medical Consultants:  GI - phone call only ([facility])  Other Consultants:  Physical therapy Respiratory therapy  SLP  IAnti-Infectives:   Doxycycline 10/20/2014 -->   Richarda Overlie, MD  [facility] Pager [phone]  If 7PM-7AM, please contact night-coverage [email or web address] Password [facility] 10/24/2014, 12:23 PM   LOS: 4 days    HPI/Subjective: Breathing is better, still having difficulty swallowing per daughter  Objective: Filed Vitals:   10/23/14 1609 10/23/14 1949 10/23/14 2136 10/24/14 0513  BP: 155/69  136/64 153/67  Pulse: 71  70 68  Temp: 98.3 F (36.8 C)  98.2 F (36.8 C) 98.1 F (36.7 C)  TempSrc: Oral  Oral Oral  Resp: Height:      Weight:      SpO2: 94% 93% 95% 95%    Intake/Output Summary (Last 24 hours) at 10/24/14 1223 Last data filed at 10/24/14 1107  Gross per 24 hour  Intake    360 ml  Output   1050 ml  Net   -690 ml    Exam:   General:  Pt is alert, not in acute distress  Cardiovascular: Regular rate and rhythm, S1/S2 (+)  Respiratory: no wheezing, no crackles, no rhonchi  Abdomen: Soft, non tender, non distended, bowel sounds present  Extremities: No edema, pulses DP and PT palpable bilaterally  Neuro: Grossly nonfocal  Data Reviewed: Basic Metabolic Panel:  Recent Labs Lab 10/20/14 1205 10/20/14 2006 10/21/14 0318 10/22/14 0640 10/23/14 0825  NA 122*  --  122* 128* 135  K 4.2  --  3.6 3.1* 3.5  CL 87*  --  90* 97 103  CO2 23  --  GLUCOSE 102*  --  120* 86 84  BUN 22  --  CREATININE 0.68  --  0.65 0.67 0.75  CALCIUM 9.3  --  8.7 8.5 8.8  MG  --  1.5  --   --   --    Liver Function Tests: No results for input(s): AST, ALT, ALKPHOS, BILITOT, PROT, ALBUMIN in the last  168 hours. No results for input(s): LIPASE, AMYLASE in the last 168 hours. No results for input(s): AMMONIA in the last 168 hours. CBC:  Recent Labs Lab 10/20/14 1205 10/21/14 0318 10/23/14 0825  WBC 11.0* 6.6 9.9  NEUTROABS 7.6  --   --   HGB 13.3 12.8 13.4  HCT 38.7 36.9 39.7  MCV 87.2 85.8 89.8  PLT 258 209 285   Cardiac Enzymes:  Recent Labs Lab 10/20/14 1640 10/20/14 2152 10/21/14 0318  TROPONINI 0.05* 0.04* 0.04*   BNP: Invalid input(s): POCBNP CBG: No results for input(s): GLUCAP in the last 168 hours.  Recent Results (from the past 240 hour(s))  Urine culture     Status: None   Collection Time: 10/20/14  9:09 PM  Result Value  Ref Range Status   Specimen Description URINE, CLEAN CATCH  Final   Special Requests NONE  Final   Colony Count   Final   Culture   Final    Multiple bacterial morphotypes present, none predominant. Suggest appropriate recollection if clinically indicated. Performed at [facility]    Report Status 10/22/2014 FINAL  Final     Scheduled Meds: . docusate sodium  100 mg Oral BID  . doxycycline  100 mg Oral Q12H  . escitalopram  10 mg Oral Daily  . gabapentin  300 mg Oral TID  . guaiFENesin  1,200 mg Oral BID  . ipratropium-albuterol  3 mL Nebulization Q6H  . lisinopril  20 mg Oral Daily  . methylPREDNISolone   40 mg Intravenous Q24H  . zolpidem  10 mg Oral QHS

## 2014-10-24 NOTE — Progress Notes (Signed)
PT Cancellation Note  Patient Details Name: Janet CravenLucille C Fahrner MRN: [id number] DOB: [birth year]   Cancelled Treatment:    Reason Eval/Treat Not Completed: Fatigue/lethargy limiting ability to participate (pt refused).  Despite my best efforts and education re: needing to get up and walk to prevent further weakness and deconditioning that may prevent her from being able to go home, pt refused to get up and walk again today.  She reports that she is too tired, she is waiting on her daughter to bring her robe, and she has not had much solid food (which is true), so she cannot do it right now.  If pt continues to refuse, we will have to discharge her from therapy services per departmental standards.  Thanks,    Rollene Rotundaebecca B. Lorilyn Laitinen, PT, DPT [phone]   10/24/2014, 3:49 PM

## 2014-10-24 NOTE — Progress Notes (Signed)
Speech Language Pathology Treatment: Dysphagia  Patient Details Name: Janet CravenLucille C Bostwick MRN: [id number] DOB: [birth year] Today's Date: 10/24/2014 Time: 1350-1410 SLP Time Calculation (min) (ACUTE ONLY): 20 min  Assessment / Plan / Recommendation Clinical Impression  Dysphagia treatment provided for diet tolerance/ advancement, compensatory strategy training, and education. Pt demonstrated no overt s/s of aspiration with thin liquids or dysphagia 2 consistency foods. Diet order indicated full liquid at time of evaluation; however 2 MD notes from 3/28 and 3/29 indicated that pt is on dysphagia 2 diet. Confirmed with RN and updated diet to dysphagia 2. Recommend continuing this diet with meds crushed in puree. Explained barium swallow evaluation results to pt and educated on compensatory strategies: alternate foods/ liquids, small bites/ sips, sit upright 30-60 minutes after meal. SLP will f/u at least x1 to ensure diet tolerance/ consider further advancement.   HPI HPI: 81 y.o. female admitted with acute respiratory distress, COPD exacerbation, anxiety, dysphagia with pills.  No prior swallow studies per record review.     Pertinent Vitals Pain Assessment: No/denies pain  SLP Plan  Continue with current plan of care    Recommendations Diet recommendations: Dysphagia 2 (fine chop);Thin liquid Liquids provided via: Cup;Straw Medication Administration: Crushed with puree Supervision: Patient able to self feed;Intermittent supervision to cue for compensatory strategies Compensations: Slow rate;Small sips/bites;Follow solids with liquid Postural Changes and/or Swallow Maneuvers: Seated upright 90 degrees;Upright 30-60 min after meal              Oral Care Recommendations: Oral care BID Follow up Recommendations: None Plan: Continue with current plan of care    GO     [place] KungOleksiak, Amy K, MA, CCC-SLP 10/24/2014, 2:13 PM

## 2014-10-24 NOTE — Consult Note (Signed)
[facility] CONSULT NOTE    Date: 10/24/2014                  Patient Name:  Janet Brooks  MRN: [id number]  DOB: [birth year]  Age / Sex: 81 y.o., female         PCP: Mickie Hillier, MD                 Service Requesting Consult: Hospitalist                  Reason for Consult: Pill dysphagia             History of Present Illness: Patient is a 81 y.o. female with a PMHx hiatal hernia, CBD stones s/p ERCP with sweep of stones(10/2010-Dr. Elnoria Howard), s/p cholecystectomy for chronic active cholecystitis and cholelithiasis (10/2010), glaucoma, stroke, HTN, anxiety, spincal stenosis, chronic pain, who was admitted to [facility] on 10/20/2014 for evaluation of SOB, atypical chest pain then developed difficulty in swallowing pills.  She does not have a history of dysphagia prior to now. Per daughter and review on notes (H&P) she does has a history of ? h/o GERD and regurgitation symptoms.  History from the patient, she states her swallowing today is better but overall declining.  She is having to have her pills crushed this admission.  She does not give a timeline as when the dysphagia started.  At home she was able to swallow pills whole though it was difficult.  She eats a regular diet at home though chops her food into small pieces and does not has trouble swallowing liquids.  She does admit to having regurgitation symptoms as well.  She denies heartburn, sour taste in mouth, decreased appetite, weight loss, nausea/vomiting, abdominal pain, vomiting blood, pain with swallowing, food impaction, coughing or choking with food though states she does have white mucous at times.  She denies h/o surgery on her esophagus/throat, h/o radiation.  She does take Aspirin and denies use of NSAIDS.     Reviewed previous GI records 10/2010 seen by Dr. Elnoria Howard inpatient for ERCP for CBD stones Medications: Outpatient medications: Prescriptions prior to admission  Medication Sig Dispense Refill Last Dose  .  ALPRAZolam (XANAX) 0.25 MG tablet Take 0.25-0.5 mg by mouth daily as needed for anxiety.   10/19/2014 at Unknown time  . aspirin EC 325 MG tablet Take 1 tablet (325 mg total) by mouth daily. 30 tablet 0 10/19/2014 at Unknown time  . dorzolamide (TRUSOPT) 2 % ophthalmic solution Place 1 drop into both eyes every 12 (twelve) hours.     10/19/2014 at Unknown time  . escitalopram (LEXAPRO) 10 MG tablet Take 10 mg by mouth daily.   10/19/2014 at Unknown time  . gabapentin (NEURONTIN) 100 MG capsule Take 300 mg by mouth every 8 (eight) hours.    10/19/2014 at Unknown time  . hydrochlorothiazide (HYDRODIURIL) 25 MG tablet Take 25 mg by mouth daily.     10/19/2014 at Unknown time  . lisinopril (PRINIVIL,ZESTRIL) 20 MG tablet Take 20 mg by mouth daily.  0 10/19/2014 at Unknown time  . propranolol (INDERAL LA) 80 MG 24 hr capsule Take 80 mg by mouth daily.     10/19/2014 at 0800  . timolol (BETIMOL) 0.5 % ophthalmic solution Place 1 drop into both eyes every 12 (twelve) hours.     10/19/2014 at Unknown time  . zolpidem (AMBIEN) 10 MG tablet Take 10 mg by mouth at bedtime.    10/19/2014 at Unknown  time  . divalproex (DEPAKOTE ER) 250 MG 24 hr tablet Take 2 tablets (500 mg total) by mouth daily. Start one tablet daily for 2 weeks then 2 tablets daily (Patient not taking: Reported on 10/20/2014) 60 tablet 3 Not Taking at Unknown time    Current medications: Current Facility-Administered Medications  Medication Dose Route Frequency Provider Last Rate Last Dose  . acetaminophen (TYLENOL) tablet 650 mg  650 mg Oral Q6H PRN Stephani PoliceMarianne L York, PA-C       Or  . acetaminophen (TYLENOL) suppository 650 mg  650 mg Rectal Q6H PRN Stephani PoliceMarianne L York, PA-C      . ALPRAZolam Prudy Feeler(XANAX) tablet 0.25 mg  0.25 mg Oral BID PRN Alison MurrayAlma M Devine, MD   0.25 mg at 10/24/14 1043  . alum & mag hydroxide-simeth (MAALOX/MYLANTA) 200-200-20 MG/5ML suspension 30 mL  30 mL Oral Q6H PRN Stephani PoliceMarianne L York, PA-C      . benzonatate (TESSALON) capsule 100 mg  100  mg Oral BID PRN Rolan Lipahomas Michael Callahan, NP      . docusate sodium (COLACE) capsule 100 mg  100 mg Oral BID Stephani PoliceMarianne L York, PA-C   100 mg at 10/23/14 2223  . dorzolamide (TRUSOPT) 2 % ophthalmic solution 1 drop  1 drop Both Eyes Q12H Stephani PoliceMarianne L York, PA-C   1 drop at 10/24/14 1044  . doxycycline (VIBRA-TABS) tablet 100 mg  100 mg Oral Q12H Alison MurrayAlma M Devine, MD   100 mg at 10/24/14 1042  . escitalopram (LEXAPRO) tablet 10 mg  10 mg Oral Daily Stephani PoliceMarianne L York, PA-C   10 mg at 10/24/14 1042  . gabapentin (NEURONTIN) capsule 300 mg  300 mg Oral TID Stephani PoliceMarianne L York, PA-C   300 mg at 10/24/14 1042  . guaiFENesin (MUCINEX) 12 hr tablet 1,200 mg  1,200 mg Oral BID Stephani PoliceMarianne L York, PA-C   1,200 mg at 10/24/14 1042  . ipratropium-albuterol (DUONEB) 0.5-2.5 (3) MG/3ML nebulizer solution 3 mL  3 mL Nebulization Q4H PRN Alison MurrayAlma M Devine, MD      . ipratropium-albuterol (DUONEB) 0.5-2.5 (3) MG/3ML nebulizer solution 3 mL  3 mL Nebulization TID Alison MurrayAlma M Devine, MD   3 mL at 10/24/14 0800  . lisinopril (PRINIVIL,ZESTRIL) tablet 20 mg  20 mg Oral Daily Stephani PoliceMarianne L York, PA-C   20 mg at 10/24/14 1042  . menthol-cetylpyridinium (CEPACOL) lozenge 3 mg  1 lozenge Oral PRN Alison MurrayAlma M Devine, MD   3 mg at 10/21/14 1831  . ondansetron (ZOFRAN) tablet 4 mg  4 mg Oral Q6H PRN Stephani PoliceMarianne L York, PA-C       Or  . ondansetron [facility](ZOFRAN) injection 4 mg  4 mg Intravenous Q6H PRN Stephani PoliceMarianne L York, PA-C      . pneumococcal 23 valent vaccine (PNU-IMMUNE) injection 0.5 mL  0.5 mL Intramuscular Tomorrow-1000 Elease EtienneAnand D Hongalgi, MD      . predniSONE (DELTASONE) tablet 50 mg  50 mg Oral Q breakfast Alison MurrayAlma M Devine, MD   50 mg at 10/24/14 0756  . sodium chloride 0.9 % injection 3 mL  3 mL Intravenous Q12H Marianne L York, PA-C   3 mL at 10/24/14 1043  . timolol (TIMOPTIC) 0.5 % ophthalmic solution 1 drop  1 drop Both Eyes Q12H Stephani PoliceMarianne L York, PA-C   1 drop at 10/24/14 1044  . zolpidem (AMBIEN) tablet 10 mg  10 mg Oral QHS Stephani PoliceMarianne L York, PA-C   10 mg at  10/23/14 2224      Allergies: Allergies  Allergen  Reactions  . Codeine Itching and Rash      Past Medical History: Past Medical History  Diagnosis Date  . Glaucoma   . Spinal stenosis   . Hypertension      Past Surgical History: Past Surgical History  Procedure Laterality Date  . Partial hysterectomy    . Back surgery       Family History: Family History  Problem Relation Age of Onset  . Stroke Mother      Social History: History   Social History  . Marital Status: Widowed    Spouse Name: N/A  . Number of Children: 3  . Years of Education: 12   Occupational History  . Not on file.   Social History Main Topics  . Smoking status: Never Smoker   . Smokeless tobacco: Never Used  . Alcohol Use: No  . Drug Use: No  . Sexual Activity: Not on file   Other Topics Concern  . Not on file   Social History Narrative   Patient is widowed with 3 children.   Patient has hs education.   Patient drinks 1 cup daily.   Patient is right handed.  husband of 54 years died 17 years ago  Lives alone 2/3 children living. Son died 1 year ago liver cancer  Denies h/o smoking but has been exposed to second hand smoke over the years   Review of Systems: As per HPI  Vital Signs: Blood pressure 153/67, pulse 68, temperature 98.1 F (36.7 C), temperature source Oral, resp. rate 17, height 4\' 11"  (1.499 m), weight 99 lb 3.3 oz (45 kg), SpO2 95 %.  Weight trends: Filed Weights   10/20/14 1155 10/21/14 0545  Weight: 110 lb (49.896 kg) 99 lb 3.3 oz (45 kg)    Physical Exam: General: Vital signs reviewed and noted. Well-developed, well-nourished, in no acute distress; alert, appropriate and cooperative throughout examination. Sitting up in recliner eating, thin body habitus  Head: Normocephalic, atraumatic.  Eyes: No signs of anemia or jaundince.  Nose: Mucous membranes moist, not inflammed, nonerythematous.  Throat: Not examined   Neck: No deformities, masses, or  tenderness noted.Supple, No carotid Bruits, no JVD.  Lungs:  Normal respiratory effort. Clear to auscultation BL without crackles or wheezes.  Heart: RRR. S1 and S2 normal without gallop, murmur, or rubs.  Abdomen:  BS normoactive. Soft, Nondistended, non-tender.  No masses or organomegaly.  Extremities: Trace edema b/l  Neurologic: A&O X3, CN II - XII are grossly intact. Moving all 4 extremities   Skin: No visible rashes, scars.    Lab results: Basic Metabolic Panel:  Recent Labs Lab 10/20/14 2006 10/21/14 0318 10/22/14 0640 10/23/14 0825  NA  --  122* 128* 135  K  --  3.6 3.1* 3.5  CL  --  90* 97 103  CO2  --  26 23 26   GLUCOSE  --  120* 86 84  BUN  --  16 16 15   CREATININE  --  0.65 0.67 0.75  CALCIUM  --  8.7 8.5 8.8  MG 1.5  --   --   --     CBC:  Recent Labs Lab 10/20/14 1205 10/21/14 0318 10/23/14 0825  WBC 11.0* 6.6 9.9  NEUTROABS 7.6  --   --   HGB 13.3 12.8 13.4  HCT 38.7 36.9 39.7  MCV 87.2 85.8 89.8  PLT 258 209 285    Cardiac Enzymes:  Recent Labs Lab 10/20/14 1640 10/20/14 2152 10/21/14 0318  TROPONINI 0.05*  0.04* 0.04*    Microbiology: Results for orders placed or performed during the hospital encounter of 10/20/14  Urine culture     Status: None   Collection Time: 10/20/14  9:09 PM  Result Value Ref Range Status   Specimen Description URINE, CLEAN CATCH  Final   Special Requests NONE  Final   Colony Count   Final    50,000 COLONIES/ML Performed at [facility]    Culture   Final    Multiple bacterial morphotypes present, none predominant. Suggest appropriate recollection if clinically indicated. Performed at [facility]    Report Status 10/22/2014 FINAL  Final     Imaging: Dg Esophagus  10/23/2014   CLINICAL DATA:  Early satiety. Globus sensation. Dysphagia with pills. Hiatal hernia.  EXAM: ESOPHOGRAM/BARIUM SWALLOW  TECHNIQUE: Single contrast examination was performed using  thin barium.  FLUOROSCOPY TIME:   Dose area product 201 microGy*m^2  COMPARISON:  06/01/2014  FINDINGS: The patient was able to tolerate individual swallows laying supine in the LPO position, but not multiple swallows, other positions, or being upright. Accordingly, this is a much more limited assessment in the typical esophagram, with resulting reduced sensitivity, specificity, and negative predictive value.  The disruption of primary peristaltic waves was observed on multiple swallows in the upper thoracic esophagus.  In the mid thoracic esophagus, there is a region of narrowing at about the level of the superior pulmonary veins but I was able to distend up to about 5 millimeters. The esophagus may twist or slightly fold in this vicinity.  Is a small type 1 hiatal hernia. Just above the level of the hernia, I was able to distend the esophagus to about 11 millimeters maximum. There were tertiary contractions in this vicinity.  Below the hiatal hernia, there was narrowing, and contrast with slowly per delayed from the hiatal hernia into the stomach through this 2-3 millimeter fairly smoothly marginated channel.  Atherosclerotic aortic arch noted.  IMPRESSION: 1. There 3 areas of potential stricturing in the esophagus. A narrowing at the level of the pulmonary veins was not able to be distended beyond about 5 millimeters on swallows, but a 13 millimeter barium tablet made its way pass this narrowing. The tablet impacted above the hiatal hernia, where the maximum distensible diameter of the esophagus was about 11 millimeters. The tight fist appearing narrowing was below the small type 1 hiatal hernia, which I was only able to distend up to 2-3 millimeters. 2. Nonspecific esophageal dysmotility disorder, with disruption of all primary peristaltic waves in the upper thoracic esophagus, and scattered tertiary contractions. 3. Extensive mobility issues mid that we could only image the patient in the LPO position. This results in a considerably reduced  sensitivity, specificity, and negative predictive value compared to the full esophagram exam, which the patient was not capable of. Endoscopy may be warranted.   Electronically Signed   By: Gaylyn Rong M.D.   On: 10/23/2014 14:34      Assessment & Plan: Ms. Nobis is a 81 y.o. yo female with a PMHX as mentioned, was admitted to [facility] on 10/20/2014 with atypical chest pain and dyspnea found to have pill dysphagia.   #Dysphagia  -Etiology likely 2/2 strictures, esophageal dysmotility, and could have component esophageal spasm with history of atypical chest pain and disruption of peristaltic waves noted on barium swallow -SLP evaluated. On 10/23/14 barium swallow which indicated 3 areas of potential stricture and nonspecific esophageal dysmotility with disruption of all primary peristaltic waves in  upper thoracic esophagus, and scattered tertiary contractions. Extensive mobility issues mid that only imaged the patient in the LPO position.   -NPO MN for possible Endoscopy in the am +/- esophageal biopsies. Dr. Madilyn Fireman will evaluate for the need for endoscopy 3/30 am on rounds -possibly could benefit from addition of PPI will consider -dys 2 (fine chop with thin liq) diet per SLP, pt is tolerating SLP evaluating pt again currently in the room -other medical issues per primary team  #DVT PPX  - scds  Code: DNR/I  Desma Maxim MD  PGY 3 IM  [facility]-will discuss with Dr. Madilyn Fireman So personally examined this patient and discussed with Dr. Ronelle Nigh. She seems to have very mild swallowing deficits and does not feel that she needs something done. We'll try to discuss with her daughter who feeds her most of her meals to see the degree of dysphagia before proceeding with endoscopy. We'll go ahead and let her eat today. Speech therapy was to evaluate yesterday but she was too tired and will try again today.

## 2014-10-25 MED ORDER — DOCUSATE SODIUM 100 MG PO CAPS: 100.0000 mg | ORAL_CAPSULE | Freq: Two times a day (BID) | ORAL | Status: DC

## 2014-10-25 MED ORDER — IPRATROPIUM-ALBUTEROL 0.5-2.5 (3) MG/3ML IN SOLN: 3.0000 mL | Freq: Four times a day (QID) | RESPIRATORY_TRACT | Status: DC | PRN

## 2014-10-25 MED ORDER — BENZONATATE 100 MG PO CAPS: 100.0000 mg | ORAL_CAPSULE | Freq: Two times a day (BID) | ORAL | Status: DC | PRN

## 2014-10-25 MED ORDER — GUAIFENESIN 100 MG/5ML PO SYRP: 200.0000 mg | ORAL_SOLUTION | Freq: Four times a day (QID) | ORAL | Status: AC

## 2014-10-25 MED ORDER — PREDNISONE 5 MG PO TABS: ORAL_TABLET | ORAL | Status: DC

## 2014-10-25 MED ORDER — IPRATROPIUM-ALBUTEROL 0.5-2.5 (3) MG/3ML IN SOLN: 3.0000 mL | RESPIRATORY_TRACT | Status: AC | PRN

## 2014-10-25 MED ORDER — PANTOPRAZOLE SODIUM 40 MG PO TBEC: 40.0000 mg | DELAYED_RELEASE_TABLET | Freq: Two times a day (BID) | ORAL | Status: DC

## 2014-10-25 NOTE — Progress Notes (Signed)
SLP Cancellation Note  Patient Details Name: Janet Brooks MRN: [id number] DOB: [birth year]   Cancelled treatment:       Reason Eval/Treat Not Completed: Other (comment) (for possible endoscopy)  Donavan Burnetamara Tashi Band, MS [facility] SLP [phone]

## 2014-10-25 NOTE — Progress Notes (Signed)
[facility] Progress note  Subjective: Pt doing well.  Still tolerating dys 2 diet.  She c/o wheezing. Daughter notes pt jittery and wants to know why.  Explained may be steroids or breathing treatments and pt just had a breathing tx.   Objective: Vital signs in last 24 hours: Filed Vitals:   10/24/14 2110 10/25/14 0527 10/25/14 0817 10/25/14 0823  BP: 108/68 118/67    Pulse: 65 68    Temp: 98.2 F (36.8 C) 98.5 F (36.9 C)    TempSrc: Oral Oral    Resp: 16 15    Height:      Weight:      SpO2: 95% 94% 92% 92%   Weight change:   Intake/Output Summary (Last 24 hours) at 10/25/14 0827 Last data filed at 10/25/14 0530  Gross per 24 hour  Intake    480 ml  Output    850 ml  Net   -370 ml   Vitals reviewed. General: resting in bed, NAD HEENT: [place]/at, no scleral icterus Cardiac: RRR, no rubs, murmurs or gallops Pulm: expiratory wheezing b/l  Abd: soft, nontender, nondistended, BS present Ext: warm and well perfused, no pedal edema Neuro: alert and oriented X to person, place, cranial nerves II-XII grossly intact, moving all 4 extremities.   Lab Results: Basic Metabolic Panel:  Recent Labs Lab 10/20/14 2006  10/22/14 0640 10/23/14 0825  NA  --   < > 128* 135  K  --   < > 3.1* 3.5  CL  --   < > 97 103  CO2  --   < > 23 26  GLUCOSE  --   < > 86 84  BUN  --   < > 16 15  CREATININE  --   < > 0.67 0.75  CALCIUM  --   < > 8.5 8.8  MG 1.5  --   --   --   < > = values in this interval not displayed.  CBC:  Recent Labs Lab 10/20/14 1205 10/21/14 0318 10/23/14 0825  WBC 11.0* 6.6 9.9  NEUTROABS 7.6  --   --   HGB 13.3 12.8 13.4  HCT 38.7 36.9 39.7  MCV 87.2 85.8 89.8  PLT 258 209 285   Cardiac Enzymes:  Recent Labs Lab 10/20/14 1640 10/20/14 2152 10/21/14 0318  TROPONINI 0.05* 0.04* 0.04*   Thyroid Function Tests:  Recent Labs Lab 10/20/14 1640  TSH 1.937   Urinalysis:  Recent Labs Lab 10/20/14 2109  COLORURINE YELLOW  LABSPEC  1.023  PHURINE 6.0  GLUCOSEU NEGATIVE  HGBUR NEGATIVE  BILIRUBINUR NEGATIVE  KETONESUR NEGATIVE  PROTEINUR 30*  UROBILINOGEN 0.2  NITRITE NEGATIVE  LEUKOCYTESUR NEGATIVE     Micro Results: Recent Results (from the past 240 hour(s))  Urine culture     Status: None   Collection Time: 10/20/14  9:09 PM  Result Value Ref Range Status   Specimen Description URINE, CLEAN CATCH  Final   Special Requests NONE  Final   Colony Count   Final    50,000 COLONIES/ML Performed at [facility]    Culture   Final    Multiple bacterial morphotypes present, none predominant. Suggest appropriate recollection if clinically indicated. Performed at [facility]    Report Status 10/22/2014 FINAL  Final   Studies/Results: Dg Esophagus  10/23/2014   CLINICAL DATA:  Early satiety. Globus sensation. Dysphagia with pills. Hiatal hernia.  EXAM: ESOPHOGRAM/BARIUM SWALLOW  TECHNIQUE: Single contrast examination was performed using  thin barium.  FLUOROSCOPY TIME:  Dose area product 201 microGy*m^2  COMPARISON:  06/01/2014  FINDINGS: The patient was able to tolerate individual swallows laying supine in the LPO position, but not multiple swallows, other positions, or being upright. Accordingly, this is a much more limited assessment in the typical esophagram, with resulting reduced sensitivity, specificity, and negative predictive value.  The disruption of primary peristaltic waves was observed on multiple swallows in the upper thoracic esophagus.  In the mid thoracic esophagus, there is a region of narrowing at about the level of the superior pulmonary veins but I was able to distend up to about 5 millimeters. The esophagus may twist or slightly fold in this vicinity.  Is a small type 1 hiatal hernia. Just above the level of the hernia, I was able to distend the esophagus to about 11 millimeters maximum. There were tertiary contractions in this vicinity.  Below the hiatal hernia, there was narrowing,  and contrast with slowly per delayed from the hiatal hernia into the stomach through this 2-3 millimeter fairly smoothly marginated channel.  Atherosclerotic aortic arch noted.  IMPRESSION: 1. There 3 areas of potential stricturing in the esophagus. A narrowing at the level of the pulmonary veins was not able to be distended beyond about 5 millimeters on swallows, but a 13 millimeter barium tablet made its way pass this narrowing. The tablet impacted above the hiatal hernia, where the maximum distensible diameter of the esophagus was about 11 millimeters. The tight fist appearing narrowing was below the small type 1 hiatal hernia, which I was only able to distend up to 2-3 millimeters. 2. Nonspecific esophageal dysmotility disorder, with disruption of all primary peristaltic waves in the upper thoracic esophagus, and scattered tertiary contractions. 3. Extensive mobility issues mid that we could only image the patient in the LPO position. This results in a considerably reduced sensitivity, specificity, and negative predictive value compared to the full esophagram exam, which the patient was not capable of. Endoscopy may be warranted.   Electronically Signed   By: Gaylyn Rong M.D.   On: 10/23/2014 14:34   Medications:  Scheduled Meds: . docusate sodium  100 mg Oral BID  . dorzolamide  1 drop Both Eyes Q12H  . doxycycline  100 mg Oral Q12H  . escitalopram  10 mg Oral Daily  . gabapentin  300 mg Oral TID  . guaifenesin  200 mg Oral QID  . ipratropium-albuterol  3 mL Nebulization TID  . lisinopril  20 mg Oral Daily  . pantoprazole (PROTONIX) IV  40 mg Intravenous Q12H  . pneumococcal 23 valent vaccine  0.5 mL Intramuscular Tomorrow-1000  . predniSONE  50 mg Oral Q breakfast  . sodium chloride  3 mL Intravenous Q12H  . timolol  1 drop Both Eyes Q12H  . zolpidem  10 mg Oral QHS   Continuous Infusions:  PRN Meds:.acetaminophen **OR** acetaminophen, ALPRAZolam, alum & mag hydroxide-simeth,  benzonatate, ipratropium-albuterol, menthol-cetylpyridinium, ondansetron **OR** ondansetron (ZOFRAN) IV Assessment/Plan: Ms. Aggarwal is a 81 y.o. yo female with a PMHX as mentioned, was admitted to [facility] on 10/20/2014 with atypical chest pain and dyspnea found to have pill dysphagia.   #Dysphagia  -Etiology likely 2/2 strictures, esophageal dysmotility, and could have component esophageal spasm with history of atypical chest pain and disruption of peristaltic waves noted on barium swallow -SLP evaluated. On 10/23/14 barium swallow which indicated 3 areas of potential stricture and nonspecific esophageal dysmotility with disruption of all primary peristaltic waves in upper thoracic esophagus, and  scattered tertiary contractions. Extensive mobility issues mid that only imaged the patient in the LPO position.  -Hold on endoscopy per Dr. Madilyn FiremanHayes will try conservative measures for now  -possibly could benefit from addition of PPI will consider -dys 2 (fine chop with thin liq) diet per SLP -other medical issues per primary team  #DVT PPX  - scds  Code: DNR/I    Annett Gularacy N McLean, MD 10/25/2014, 8:27 AM  [facility]Will disc. With Dr. Madilyn FiremanHayes   GI addendum : Spoke to daughter and it sounds like she had a fairly isolated episode of pill dysphagia that partly precipitated her to come to the hospital but that she has not been having much before or since. We decided based on her age to hold off on EGD for now and if she develops more frequent dysphagia Will consider EGD with dilatation. We'll sign off for now.

## 2014-10-25 NOTE — Discharge Instructions (Signed)
Diet recommendations: Dysphagia 2 (fine chop);Thin liquid Liquids provided via: Cup;Straw Medication Administration: Crushed with puree Supervision: Patient able to self feed;Intermittent supervision to cue for compensatory strategies Compensations: Slow rate;Small sips/bites;Follow solids with liquid Postural Changes and/or Swallow Maneuvers: Seated upright 90 degrees;Upright 30-60 min after meal

## 2014-10-25 NOTE — Progress Notes (Signed)
Physician Discharge Summary  Janet CravenLucille C Brooks MRN: [id number] DOB/AGE: 02/15/1921 81 y.o.  PCP: Janet HillierLITTLE,[name], Brooks   Admit date: 10/20/2014 Discharge date: 10/25/2014  Discharge Diagnoses:     Principal Problem:   Acute on chronic respiratory failure with hypoxia Active Problems:   COPD exacerbation   Anxiety   Atypical chest pain   Spinal stenosis   Hyponatremia   Follow-up recommendations Follow-up with PCP in 5-7 days Follow-up CBC and BMP in one week      Medication List    STOP taking these medications        aspirin EC 325 MG tablet     hydrochlorothiazide 25 MG tablet  Commonly known as:  HYDRODIURIL      TAKE these medications        ALPRAZolam 0.25 MG tablet  Commonly known as:  XANAX  Take 0.25-0.5 mg by mouth daily as needed for anxiety.     AMBIEN 10 MG tablet  Generic drug:  zolpidem  Take 10 mg by mouth at bedtime.     benzonatate 100 MG capsule  Commonly known as:  TESSALON  Take 1 capsule (100 mg total) by mouth 2 (two) times daily as needed for cough.     divalproex 250 MG 24 hr tablet  Commonly known as:  DEPAKOTE ER  Take 2 tablets (500 mg total) by mouth daily. Start one tablet daily for 2 weeks then 2 tablets daily     docusate sodium 100 MG capsule  Commonly known as:  COLACE  Take 1 capsule (100 mg total) by mouth 2 (two) times daily.     dorzolamide 2 % ophthalmic solution  Commonly known as:  TRUSOPT  Place 1 drop into both eyes every 12 (twelve) hours.     escitalopram 10 MG tablet  Commonly known as:  LEXAPRO  Take 10 mg by mouth daily.     gabapentin 100 MG capsule  Commonly known as:  NEURONTIN  Take 300 mg by mouth every 8 (eight) hours.     guaifenesin 100 MG/5ML syrup  Commonly known as:  ROBITUSSIN  Take 10 mLs (200 mg total) by mouth 4 (four) times daily.     ipratropium-albuterol 0.5-2.5 (3) MG/3ML Soln  Commonly known as:  DUONEB  Take 3 mLs by nebulization every 4 (four) hours as needed.      lisinopril 20 MG tablet  Commonly known as:  PRINIVIL,ZESTRIL  Take 20 mg by mouth daily.     pantoprazole 40 MG tablet  Commonly known as:  PROTONIX  Take 1 tablet (40 mg total) by mouth 2 (two) times daily.     predniSONE 5 MG tablet  Commonly known as:  DELTASONE  - 8 tablets for 3 days  - 7 tablets for 3 days  - 6 tablets for 3 days  - 5 tablets for 3 days  - 4 tablets for 3 days  - 3 tablets for 3 days  - 2 tablets for 3 days  - 1 tablet for 3 days then DC     propranolol ER 80 MG 24 hr capsule  Commonly known as:  INDERAL LA  Take 80 mg by mouth daily.     timolol 0.5 % ophthalmic solution  Commonly known as:  BETIMOL  Place 1 drop into both eyes every 12 (twelve) hours.        Discharge Condition: Stable   Disposition: 01-Home or Self Care   Consults: Gastroenterology   Significant Diagnostic  Studies: Dg Chest 2 View  10/20/2014   CLINICAL DATA:  Shortness of breath, cough  EXAM: CHEST  2 VIEW  COMPARISON:  03/30/2014  FINDINGS: Mild right basilar scarring/ atelectasis. No focal consolidation. No pleural effusion or pneumothorax.  The heart is top-normal in size.  Visualized osseous structures are within normal limits.  IMPRESSION: No evidence of acute cardiopulmonary disease.   Electronically Signed   By: Charline Bills M.D.   On: 10/20/2014 13:13   Dg Esophagus  10/23/2014   CLINICAL DATA:  Early satiety. Globus sensation. Dysphagia with pills. Hiatal hernia.  EXAM: ESOPHOGRAM/BARIUM SWALLOW  TECHNIQUE: Single contrast examination was performed using  thin barium.  FLUOROSCOPY TIME:  Dose area product 201 microGy*m^2  COMPARISON:  06/01/2014  FINDINGS: The patient was able to tolerate individual swallows laying supine in the LPO position, but not multiple swallows, other positions, or being upright. Accordingly, this is a much more limited assessment in the typical esophagram, with resulting reduced sensitivity, specificity, and negative predictive  value.  The disruption of primary peristaltic waves was observed on multiple swallows in the upper thoracic esophagus.  In the mid thoracic esophagus, there is a region of narrowing at about the level of the superior pulmonary veins but I was able to distend up to about 5 millimeters. The esophagus may twist or slightly fold in this vicinity.  Is a small type 1 hiatal hernia. Just above the level of the hernia, I was able to distend the esophagus to about 11 millimeters maximum. There were tertiary contractions in this vicinity.  Below the hiatal hernia, there was narrowing, and contrast with slowly per delayed from the hiatal hernia into the stomach through this 2-3 millimeter fairly smoothly marginated channel.  Atherosclerotic aortic arch noted.  IMPRESSION: 1. There 3 areas of potential stricturing in the esophagus. A narrowing at the level of the pulmonary veins was not able to be distended beyond about 5 millimeters on swallows, but a 13 millimeter barium tablet made its way pass this narrowing. The tablet impacted above the hiatal hernia, where the maximum distensible diameter of the esophagus was about 11 millimeters. The tight fist appearing narrowing was below the small type 1 hiatal hernia, which I was only able to distend up to 2-3 millimeters. 2. Nonspecific esophageal dysmotility disorder, with disruption of all primary peristaltic waves in the upper thoracic esophagus, and scattered tertiary contractions. 3. Extensive mobility issues mid that we could only image the patient in the LPO position. This results in a considerably reduced sensitivity, specificity, and negative predictive value compared to the full esophagram exam, which the patient was not capable of. Endoscopy may be warranted.   Electronically Signed   By: Gaylyn Rong M.D.   On: 10/23/2014 14:34      Microbiology: Recent Results (from the past 240 hour(s))  Urine culture     Status: None   Collection Time: 10/20/14  9:09 PM   Result Value Ref Range Status   Specimen Description URINE, CLEAN CATCH  Final   Special Requests NONE  Final   Colony Count   Final    50,000 COLONIES/ML Performed at [facility]    Culture   Final    Multiple bacterial morphotypes present, none predominant. Suggest appropriate recollection if clinically indicated. Performed at [facility]    Report Status 10/22/2014 FINAL  Final     Labs: No results found for this or any previous visit (from the past 48 hour(s)).   HPI  81 y.o. female, with a past medical history of CVA, hypertension, anxiety and spinal stenosis for which she sees a chronic pain management doctor (Dr. [name] of [facility]). Patient's family reported pt developed wheezing and has seen PCP few days PTA, given inhaler, prednisone and a Z-Pak with no significant symptomatic improvement.   On admission, pt developed chest pain in mid sternal area, non radiating and has spontaneously resolved. Further, she was found to have hyponatremia with sodium level of 122. Chest x-ray was negative for opacity or infiltrate. EKG was reported to show new T-wave inversions. Troponin was slightly elevated at 0.06. TRH asked to admit for further evaluation and management of hyponatremia, COPD.  Hospital course slightly complicated with reports of dysphagia esophagogram with showed 3 areas of potential stricturing in the esophagus. Consultant [facility].   HOSPITAL COURSE:  Acute respiratory failure with hypoxia / COPD exacerbation - Respiratory status stable - Continue oxygen support via [place] to keep O2 saturation above 90% - Taper steroids, currently on 50 mg of prednisone, will taper slowly outpatient - Continue duoneb every 6 hours scheduled and every 4 hours as needed for shortness of breath or wheezing , nebulizer machine provided in the outpatient setting Patient has completed 5 days of doxycycline and therefore this is being discontinued Home oxygen  needs will be assessed   Atypical chest pain / mild troponin elevation - Mild troponin elevation likely due to demand ischemia from hypoxia and COPD - Troponin trend 0.06 --> 0.05 --> 0.04; no need to cycle cardiac enzymes. No c/o chest pain.   Hyponatremia - Secondary to dehydration and hctz. Hctz has been discontinued. PCP to follow-up on sodium outpatient   Anxiety / insomnia - Continue Lexapro, ambien   Spinal stenosis with chronic pain - Continue gabapentin.    Essential hypertension  - Continue Lisinopril.   Hypokalemia - Likely since pt was on Hctz prior to admission - Supplemented - Potassium WNL   Dysphagia to pills  - SLP eval - dysphagia 2. patient seen by [facility] , dr Mariana Kaufman on a PPI BID  dys 2 (fine chop with thin liq) diet per SLP she had a fairly isolated episode of pill dysphagia that partly precipitated her to come to the hospital but that she has not been having much before admission. GI decided based on her age to hold off on EGD for now and if she develops more frequent dysphagia will consider EGD with dilatation outpatient     Non severe PCM - In the context of acute illness  - Nutritionist consulted - On dysphagia 2 diet now   DNR/DNI   Discharge Exam:    Blood pressure 118/67, pulse 68, temperature 98.5 F (36.9 C), temperature source Oral, resp. rate 15, height  (1.499 m), weight 45 kg (99 lb 3.3 oz), SpO2 92 %.   General: Pt is alert, not in acute distress  Cardiovascular: Regular rate and rhythm, S1/S2 (+)  Respiratory: no wheezing, no crackles, no rhonchi  Abdomen: Soft, non tender, non distended, bowel sounds present  Extremities: No edema, pulses DP and PT palpable bilaterally  Neuro: Grossly nonfocal       Discharge Instructions    DME Nebulizer machine    Complete by:  As directed            Follow-up Information    Follow up with Janet Hillier, Brooks. Schedule an appointment as soon as  possible for a visit in 3 days.  Specialty:  Family Medicine   Contact information:   [address] [place] [address] [phone]       Signed: Richarda Overlie 10/25/2014, 11:15 AM

## 2014-10-25 NOTE — Progress Notes (Signed)
Physical Therapy Treatment Patient Details Name: Janet Brooks MRN: [id number] DOB: [birth year] Today's Date: 10/25/2014    History of Present Illness 81 y.o. female admitted to [facility] on 10/20/14 for SOB and chest pain.  Medical and cardiac workup in progress.  Dx with acute respiratory failure secondary to acute viral bronchitis.  Pt is (-) for flu.  Significant past medical history includes: glaucoma, spinal stenosis (per pt report with L leg radicular symptoms), HTN, and back surgery.    PT Comments    Pt has made steady improvement.  Could use further reconditioning and more reinforcement with safety.  Follow Up Recommendations  Home health PT;Other (comment) (up to 24 hours assist 1st couple of days.)     Equipment Recommendations  None recommended by PT    Recommendations for Other Services       Precautions / Restrictions Precautions Precautions: Fall Restrictions Weight Bearing Restrictions: No    Mobility  Bed Mobility Overal bed mobility: Needs Assistance             General bed mobility comments: not tested  Transfers   Equipment used: Rolling walker (2 wheeled) Transfers: Sit to/from Stand Sit to Stand: Modified independent (Device/Increase time)         General transfer comment: reinforced safe transfer technique/hand placement, locking brakes on the rollator.  Ambulation/Gait Ambulation/Gait assistance: Supervision Ambulation Distance (Feet): 200 Feet Assistive device: Rolling walker (2 wheeled) Gait Pattern/deviations: Step-through pattern Gait velocity: decreased   General Gait Details: notably more steady and dealing with RW more safely.   Stairs            Wheelchair Mobility    Modified Rankin (Stroke Patients Only)       Balance Overall balance assessment: Needs assistance Sitting-balance support: No upper extremity supported Sitting balance-Leahy Scale: Good     Standing balance support: No upper  extremity supported Standing balance-Leahy Scale: Fair                      Cognition Arousal/Alertness: Awake/alert Behavior During Therapy: WFL for tasks assessed/performed Overall Cognitive Status: Within Functional Limits for tasks assessed                      Exercises      General Comments General comments (skin integrity, edema, etc.): SpO2 94/95% on RA with gait, EHR 80's      Pertinent Vitals/Pain Pain Assessment: No/denies pain    Home Living                      Prior Function            PT Goals (current goals can now be found in the care plan section) Acute Rehab PT Goals Patient Stated Goal: to go home  PT Goal Formulation: With patient/family Time For Goal Achievement: 11/04/14 Potential to Achieve Goals: Good Progress towards PT goals: Progressing toward goals    Frequency  Min 3X/week    PT Plan Current plan remains appropriate    Co-evaluation             End of Session   Activity Tolerance: Patient tolerated treatment well Patient left: in chair;with call bell/phone within reach;with family/visitor present     Time: 7829-5621 PT Time Calculation (min) (ACUTE ONLY): 26 min  Charges:  $Gait Training: 8-22 mins $Therapeutic Activity: 8-22 mins  G Codes:      Sahily Biddle, [name] 10/25/2014, 11:30 AM 10/25/2014  [place] BingKen Jolyne Laye, PT [phone]  (pager)

## 2014-10-25 NOTE — Progress Notes (Signed)
NURSING PROGRESS NOTE  Janet CravenLucille C Brooks [id number] Discharge Data: 10/25/2014 1:59 PM Attending Provider: Richarda OverlieNayana Abrol, MD WUJ:[id number],NWGNFPCP:LITTLE,KEVIN Juel BurrowLORNE, MD     Janet Brooks to be D/C'd Home per MD order.  Discussed with the patient the After Visit Summary and all questions fully answered. All IV's discontinued with no bleeding noted. All belongings returned to patient for patient to take home.   Last Vital Signs:  Blood pressure 118/67, pulse 68, temperature 98.5 F (36.9 C), temperature source Oral, resp. rate 15, height 4\' 11"  (1.499 m), weight 45 kg (99 lb 3.3 oz), SpO2 92 %.  Discharge Medication List   Medication List    STOP taking these medications        aspirin EC 325 MG tablet     hydrochlorothiazide 25 MG tablet  Commonly known as:  HYDRODIURIL      TAKE these medications        ALPRAZolam 0.25 MG tablet  Commonly known as:  XANAX  Take 0.25-0.5 mg by mouth daily as needed for anxiety.     AMBIEN 10 MG tablet  Generic drug:  zolpidem  Take 10 mg by mouth at bedtime.     benzonatate 100 MG capsule  Commonly known as:  TESSALON  Take 1 capsule (100 mg total) by mouth 2 (two) times daily as needed for cough.     divalproex 250 MG 24 hr tablet  Commonly known as:  DEPAKOTE ER  Take 2 tablets (500 mg total) by mouth daily. Start one tablet daily for 2 weeks then 2 tablets daily     docusate sodium 100 MG capsule  Commonly known as:  COLACE  Take 1 capsule (100 mg total) by mouth 2 (two) times daily.     dorzolamide 2 % ophthalmic solution  Commonly known as:  TRUSOPT  Place 1 drop into both eyes every 12 (twelve) hours.     escitalopram 10 MG tablet  Commonly known as:  LEXAPRO  Take 10 mg by mouth daily.     gabapentin 100 MG capsule  Commonly known as:  NEURONTIN  Take 300 mg by mouth every 8 (eight) hours.     guaifenesin 100 MG/5ML syrup  Commonly known as:  ROBITUSSIN  Take 10 mLs (200 mg total) by mouth 4 (four) times daily.     ipratropium-albuterol 0.5-2.5 (3) MG/3ML Soln  Commonly known as:  DUONEB  Take 3 mLs by nebulization every 4 (four) hours as needed.     lisinopril 20 MG tablet  Commonly known as:  PRINIVIL,ZESTRIL  Take 20 mg by mouth daily.     pantoprazole 40 MG tablet  Commonly known as:  PROTONIX  Take 1 tablet (40 mg total) by mouth 2 (two) times daily.     predniSONE 5 MG tablet  Commonly known as:  DELTASONE  - 8 tablets for 3 days  - 7 tablets for 3 days  - 6 tablets for 3 days  - 5 tablets for 3 days  - 4 tablets for 3 days  - 3 tablets for 3 days  - 2 tablets for 3 days  - 1 tablet for 3 days then DC     propranolol ER 80 MG 24 hr capsule  Commonly known as:  INDERAL LA  Take 80 mg by mouth daily.     timolol 0.5 % ophthalmic solution  Commonly known as:  BETIMOL  Place 1 drop into both eyes every 12 (twelve) hours.

## 2014-11-03 ENCOUNTER — Other Ambulatory Visit: Payer: Self-pay | Admitting: Internal Medicine

## 2014-11-12 NOTE — Discharge Summary (Signed)
Physician Discharge Summary  Janet Brooks MRN: [id number] DOB/AGE: January 07, 1921 81 y.o.  PCP: [name],Janet LORNE, MD   Admit date: 10/20/2014 Discharge date: 11/12/2014  Discharge Diagnoses:     Principal Problem:   Acute on chronic respiratory failure with hypoxia Active Problems:   COPD exacerbation   Anxiety   Atypical chest pain   Spinal stenosis   Hyponatremia   Follow-up recommendations Follow-up with PCP in 5-7 days Follow-up CBC and BMP in one week      Medication List    STOP taking these medications        aspirin EC 325 MG tablet     hydrochlorothiazide 25 MG tablet  Commonly known as:  HYDRODIURIL      TAKE these medications        ALPRAZolam 0.25 MG tablet  Commonly known as:  XANAX  Take 0.25-0.5 mg by mouth daily as needed for anxiety.     AMBIEN 10 MG tablet  Generic drug:  zolpidem  Take 10 mg by mouth at bedtime.     benzonatate 100 MG capsule  Commonly known as:  TESSALON  Take 1 capsule (100 mg total) by mouth 2 (two) times daily as needed for cough.     divalproex 250 MG 24 hr tablet  Commonly known as:  DEPAKOTE ER  Take 2 tablets (500 mg total) by mouth daily. Start one tablet daily for 2 weeks then 2 tablets daily     docusate sodium 100 MG capsule  Commonly known as:  COLACE  Take 1 capsule (100 mg total) by mouth 2 (two) times daily.     dorzolamide 2 % ophthalmic solution  Commonly known as:  TRUSOPT  Place 1 drop into both eyes every 12 (twelve) hours.     escitalopram 10 MG tablet  Commonly known as:  LEXAPRO  Take 10 mg by mouth daily.     gabapentin 100 MG capsule  Commonly known as:  NEURONTIN  Take 300 mg by mouth every 8 (eight) hours.     guaifenesin 100 MG/5ML syrup  Commonly known as:  ROBITUSSIN  Take 10 mLs (200 mg total) by mouth 4 (four) times daily.     ipratropium-albuterol 0.5-2.5 (3) MG/3ML Soln  Commonly known as:  DUONEB  Take 3 mLs by nebulization every 4 (four) hours as needed.      lisinopril 20 MG tablet  Commonly known as:  PRINIVIL,ZESTRIL  Take 20 mg by mouth daily.     pantoprazole 40 MG tablet  Commonly known as:  PROTONIX  Take 1 tablet (40 mg total) by mouth 2 (two) times daily.     predniSONE 5 MG tablet  Commonly known as:  DELTASONE  - 8 tablets for 3 days  - 7 tablets for 3 days  - 6 tablets for 3 days  - 5 tablets for 3 days  - 4 tablets for 3 days  - 3 tablets for 3 days  - 2 tablets for 3 days  - 1 tablet for 3 days then DC     propranolol ER 80 MG 24 hr capsule  Commonly known as:  INDERAL LA  Take 80 mg by mouth daily.     timolol 0.5 % ophthalmic solution  Commonly known as:  BETIMOL  Place 1 drop into both eyes every 12 (twelve) hours.        Discharge Condition: Stable   Disposition: 06-Home-Health Care Svc   Consults: Gastroenterology   Significant Diagnostic Studies:  Dg Chest 2 View  10/20/2014   CLINICAL DATA:  Shortness of breath, cough  EXAM: CHEST  2 VIEW  COMPARISON:  03/30/2014  FINDINGS: Mild right basilar scarring/ atelectasis. No focal consolidation. No pleural effusion or pneumothorax.  The heart is top-normal in size.  Visualized osseous structures are within normal limits.  IMPRESSION: No evidence of acute cardiopulmonary disease.   Electronically Signed   By: Janet Brooks M.D.   On: 10/20/2014 13:13   Dg Esophagus  10/23/2014   CLINICAL DATA:  Early satiety. Globus sensation. Dysphagia with pills. Hiatal hernia.  EXAM: ESOPHOGRAM/BARIUM SWALLOW  TECHNIQUE: Single contrast examination was performed using  thin barium.  FLUOROSCOPY TIME:  Dose area product 201 microGy*m^2  COMPARISON:  06/01/2014  FINDINGS: The patient was able to tolerate individual swallows laying supine in the LPO position, but not multiple swallows, other positions, or being upright. Accordingly, this is a much more limited assessment in the typical esophagram, with resulting reduced sensitivity, specificity, and negative predictive  value.  The disruption of primary peristaltic waves was observed on multiple swallows in the upper thoracic esophagus.  In the mid thoracic esophagus, there is a region of narrowing at about the level of the superior pulmonary veins but I was able to distend up to about 5 millimeters. The esophagus may twist or slightly fold in this vicinity.  Is a small type 1 hiatal hernia. Just above the level of the hernia, I was able to distend the esophagus to about 11 millimeters maximum. There were tertiary contractions in this vicinity.  Below the hiatal hernia, there was narrowing, and contrast with slowly per delayed from the hiatal hernia into the stomach through this 2-3 millimeter fairly smoothly marginated channel.  Atherosclerotic aortic arch noted.  IMPRESSION: 1. There 3 areas of potential stricturing in the esophagus. A narrowing at the level of the pulmonary veins was not able to be distended beyond about 5 millimeters on swallows, but a 13 millimeter barium tablet made its way pass this narrowing. The tablet impacted above the hiatal hernia, where the maximum distensible diameter of the esophagus was about 11 millimeters. The tight fist appearing narrowing was below the small type 1 hiatal hernia, which I was only able to distend up to 2-3 millimeters. 2. Nonspecific esophageal dysmotility disorder, with disruption of all primary peristaltic waves in the upper thoracic esophagus, and scattered tertiary contractions. 3. Extensive mobility issues mid that we could only image the patient in the LPO position. This results in a considerably reduced sensitivity, specificity, and negative predictive value compared to the full esophagram exam, which the patient was not capable of. Endoscopy may be warranted.   Electronically Signed   By: Janet Brooks M.D.   On: 10/23/2014 14:34      Microbiology: No results found for this or any previous visit (from the past 240 hour(s)).   Labs: No results found for this  or any previous visit (from the past 48 hour(s)).   HPI  81 y.o. female, with a past medical history of CVA, hypertension, anxiety and spinal stenosis for which she sees a chronic pain management doctor (Dr. [name] of [facility]). Patient's family reported pt developed wheezing and has seen PCP few days PTA, given inhaler, prednisone and a Z-Pak with no significant symptomatic improvement.   On admission, pt developed chest pain in mid sternal area, non radiating and has spontaneously resolved. Further, she was found to have hyponatremia with sodium level of 122. Chest x-ray was  negative for opacity or infiltrate. EKG was reported to show new T-wave inversions. Troponin was slightly elevated at 0.06. [facility] asked to admit for further evaluation and management of hyponatremia, COPD.  Hospital course slightly complicated with reports of dysphagia esophagogram with showed 3 areas of potential stricturing in the esophagus. Consultant [facility].   HOSPITAL COURSE:  Acute respiratory failure with hypoxia / COPD exacerbation - Respiratory status stable - Continue oxygen support via [place] to keep O2 saturation above 90% - Taper steroids, currently on 50 mg of prednisone, will taper slowly outpatient - Continue duoneb every 6 hours scheduled and every 4 hours as needed for shortness of breath or wheezing , nebulizer machine provided in the outpatient setting Patient has completed 5 days of doxycycline and therefore this is being discontinued Home oxygen needs will be assessed   Atypical chest pain / mild troponin elevation - Mild troponin elevation likely due to demand ischemia from hypoxia and COPD - Troponin trend 0.06 --> 0.05 --> 0.04; no need to cycle cardiac enzymes. No c/o chest pain.   Hyponatremia - Secondary to dehydration and hctz. Hctz has been discontinued. PCP to follow-up on sodium outpatient   Anxiety / insomnia - Continue Lexapro, ambien   Spinal stenosis with  chronic pain - Continue gabapentin.    Essential hypertension  - Continue Lisinopril.   Hypokalemia - Likely since pt was on Hctz prior to admission - Supplemented - Potassium WNL   Dysphagia to pills  - SLP eval - dysphagia 2. patient seen by [facility] , dr Mariana Kaufman on a PPI BID  dys 2 (fine chop with thin liq) diet per SLP she had a fairly isolated episode of pill dysphagia that partly precipitated her to come to the hospital but that she has not been having much before admission. GI decided based on her age to hold off on EGD for now and if she develops more frequent dysphagia will consider EGD with dilatation outpatient     Non severe PCM - In the context of acute illness  - Nutritionist consulted - On dysphagia 2 diet now   DNR/DNI   Discharge Exam:    Blood pressure 102/76, pulse 89, temperature 98 F (36.7 C), temperature source Oral, resp. rate 18, height  (1.499 m), weight 45 kg (99 lb 3.3 oz), SpO2 95 %.   General: Pt is alert, not in acute distress  Cardiovascular: Regular rate and rhythm, S1/S2 (+)  Respiratory: no wheezing, no crackles, no rhonchi  Abdomen: Soft, non tender, non distended, bowel sounds present  Extremities: No edema, pulses DP and PT palpable bilaterally  Neuro: Grossly nonfocal   Discharge Instructions    DME Nebulizer machine    Complete by:  As directed            Follow-up Information    Follow up with Mickie Hillier, MD.   Specialty:  Family Medicine   Why:  Appt: 10/27/2014 @ 2:30 Please arrive by 2:15, please call with any concerns or changes   Contact information:   [address] [place] [address] [phone]       Follow up with [facility].   Why:  They will contact you to schedule home nurse and therapy visits.   Contact information:   [address] [place] [address] [phone]       Signed: Richarda Overlie 11/12/2014, 4:39 PM

## 2014-11-22 ENCOUNTER — Encounter: Payer: Medicare Other | Admitting: Cardiovascular Disease

## 2014-11-23 ENCOUNTER — Ambulatory Visit (INDEPENDENT_AMBULATORY_CARE_PROVIDER_SITE_OTHER): Payer: Medicare Other | Admitting: Neurology

## 2014-11-23 ENCOUNTER — Encounter: Payer: Self-pay | Admitting: Neurology

## 2014-11-23 VITALS — Wt 98.2 lb

## 2014-11-23 DIAGNOSIS — H539 Unspecified visual disturbance: Secondary | ICD-10-CM

## 2014-11-23 NOTE — Patient Instructions (Signed)
I had a long discussion with the patient and her daughter about her episodes of transient vision dysfunctions which seem to have responded quite well to Depakote. Continue Depakote SR 500 mg twice daily. I also counseled the patient about fall and safety precautions and advise her to use a walker at all times. Return for follow-up in 6 months or call earlier if necessary

## 2014-11-23 NOTE — Progress Notes (Signed)
[facility]  PATIENT: Janet Brooks DOB: 1920-11-07   HISTORY FROM: patient, daughter REASON FOR VISIT: routine follow up  HISTORY OF PRESENT ILLNESS:  81 year old Caucasian lady who woke up on 11/21/12 with sudden onset of visual disturbances. She states that everything seemed red when she opened her eyes and also noticed some wavy lines as well as geometric designs in front of both eyes. It did not matter if she closed either eye. This lasted all day and even when she was sleeping with eyes closed this was bothering her. She was able to sleep after taking Ambien. This seems to be improving slightly for the last couple of days. She denied any headache, double vision, vertigo, focal extremity weakness numbness or increasing gait or balance problems. She has no prior history of strokes, TIAs or significant neurological problems. She does admit to have been having dizzy episodes for the last 1 month. She describes this as a feeling of swimmy headedness with imbalance and needing to hold onto something to avoid falling. She denies true vertigo. This may last for days. She had prior history of dizzy spells 8 years ago for which she was evaluated [facility] [place] neurolog[place] [facility] [place] and I do not have any of those records but as per the patient all workup was negative. She was even evaluated by ENT physician at that time and no clear explanation was found. She denies any history of ablation but does feel palpitations from time to time. She had CT scan of the head done on 11/21/12 which was unremarkable an MRI scan of the brain done yesterday reviewed personally by me shows a tiny right frontal cortical infarct and a tiny chronic age right cerebellar lacunar infarct. The moderate changes of age-related chronic microvascular disease and mild effusion with cerebral atrophy. She is a independent 81 year old who lives alone with herself. She has chronic back pain and walks with a wheeled walker and  states her balance is not good. She has not had any significant recent falls.  UPDATE: 02/02/13:  Janet Brooks returns for follow up since visit on 11/24/12.  She has had no further visual disturbances since her last visit.  MRI showed tiny right frontal cortical infarct and a tiny chronic age right cerebellar lacunar infarct, but nothing to explain the visual disturbances.  She had cardiac event monitor which she wore for 9 hours, could not stand it, had to take off, she has sent it back.  She and daughter state that she would not want loop recorder because she does not want to be on strong blood thinners.  She had TTE done, which was normal.  She has not had EEG done.   Overall she is doing well, but very stressed because son is going through Stage III liver cancer and she is not sleeping well.  She has Rx for Xanax from PCP but does not want to use it.  She reports no falls at home, uses rolling walker.  She is taking Aspirin 325mg  daily and has no bleeding or bruising. Update 08/15/2014 : She is seen today upon request from primary care physician because of recurrent episodes of vision distortion, single red dots as well as [name] metric shapes off and on. This has been occurring once or twice a month. She however has history of these episodes and has been evaluated by me in the past for this. These episodes can last for several days and often triggered by anxiety and stress. She was  seen by to ophthalmologist who did not find any eye abnormalities to explain the symptoms. She denies any prior history of migraines or headaches before or during these symptoms. She had carotid ultrasound done a month and a half ago which showed no significant extracranial stenosis. She was admitted in fact a few months ago to the hospital for anxiety and panic symptoms. She is significantly bothered by severe back pain from spinal stenosis and has walking difficulties and her legs intermittently feel numb.   Update 11/23/2014 : She  returns for follow-up after last visit 3 months ago. She states that the Depakote seems to help significantly she is having less frequent headaches as well as knots having visual disturbances. She is tolerating 500 mg twice daily without significant side effects. She had an EEG done on 08/31/14 which I personally reviewed was normal without definite epileptiform activity. She was hospitalized the last month ago for episode of asthmatic bronchitis and dysphagia and is currently on tapering prednisone dose and is feeling better. She still needs some of her pills to be crushed. She has no other new complaints. REVIEW OF SYSTEMS: Full 14 system review of systems performed and notable only for:  Trouble swallowing light sensitivity constipation and melena medical allergies back pain nervousness and anxiety and all other systems negative   ALLERGIES: Allergies  Allergen Reactions  . Codeine Itching and Rash    HOME MEDICATIONS: Outpatient Prescriptions Prior to Visit  Medication Sig Dispense Refill  . ALPRAZolam (XANAX) 0.25 MG tablet Take 0.25-0.5 mg by mouth daily as needed for anxiety.    . benzonatate (TESSALON) 100 MG capsule Take 1 capsule (100 mg total) by mouth 2 (two) times daily as needed for cough. 20 capsule 0  . divalproex (DEPAKOTE ER) 250 MG 24 hr tablet Take 2 tablets (500 mg total) by mouth daily. Start one tablet daily for 2 weeks then 2 tablets daily 60 tablet 3  . docusate sodium (COLACE) 100 MG capsule Take 1 capsule (100 mg total) by mouth 2 (two) times daily. 10 capsule 0  . dorzolamide (TRUSOPT) 2 % ophthalmic solution Place 1 drop into both eyes every 12 (twelve) hours.      . gabapentin (NEURONTIN) 100 MG capsule Take 300 mg by mouth every 8 (eight) hours.     Marland Kitchen. guaifenesin (ROBITUSSIN) 100 MG/5ML syrup Take 10 mLs (200 mg total) by mouth 4 (four) times daily. 120 mL 0  . ipratropium-albuterol (DUONEB) 0.5-2.5 (3) MG/3ML SOLN Take 3 mLs by nebulization every 4 (four) hours as  needed. 360 mL 12  . lisinopril (PRINIVIL,ZESTRIL) 20 MG tablet Take 20 mg by mouth daily.  0  . pantoprazole (PROTONIX) 40 MG tablet Take 1 tablet (40 mg total) by mouth 2 (two) times daily. 60 tablet 6  . propranolol (INDERAL LA) 80 MG 24 hr capsule Take 80 mg by mouth daily.      . timolol (BETIMOL) 0.5 % ophthalmic solution Place 1 drop into both eyes every 12 (twelve) hours.      Marland Kitchen. zolpidem (AMBIEN) 10 MG tablet Take 10 mg by mouth at bedtime.     Marland Kitchen. escitalopram (LEXAPRO) 10 MG tablet Take 10 mg by mouth daily.    . predniSONE (DELTASONE) 5 MG tablet 8 tablets for 3 days 7 tablets for 3 days 6 tablets for 3 days 5 tablets for 3 days 4 tablets for 3 days 3 tablets for 3 days 2 tablets for 3 days 1 tablet for 3 days then DC  200 tablet 0   No facility-administered medications prior to visit.    PAST MEDICAL HISTORY: Past Medical History  Diagnosis Date  . Glaucoma   . Spinal stenosis   . Hypertension   . Asthmatic bronchitis     "allergy bronchitis"    PAST SURGICAL HISTORY: Past Surgical History  Procedure Laterality Date  . Partial hysterectomy    . Back surgery      FAMILY HISTORY: Family History  Problem Relation Age of Onset  . Stroke Mother     SOCIAL HISTORY: History   Social History  . Marital Status: Widowed    Spouse Name: N/A  . Number of Children: 3  . Years of Education: 12   Occupational History  . Not on file.   Social History Main Topics  . Smoking status: Never Smoker   . Smokeless tobacco: Never Used  . Alcohol Use: No  . Drug Use: No  . Sexual Activity: Not on file   Other Topics Concern  . Not on file   Social History Narrative   Patient is widowed with 3 children.   Patient has hs education.   Patient drinks 1 cup daily.   Patient is right handed.     PHYSICAL EXAM  Filed Vitals:   11/23/14 1426  Weight: 98 lb 3.2 oz (44.543 kg)   Body mass index is 19.82 kg/(m^2).  Generalized: In no acute distress, pleasant   Frail elderly Caucasian female.   Neck: Supple, no carotid bruits   Cardiac: Regular rate rhythm, soft systolic murmur   Pulmonary: Clear to auscultation bilaterally   Musculoskeletal: moderate kyphoscoliosis   Neurological examination   Mentation: Alert oriented to time, place, history taking, language fluent, and causual conversation  Cranial nerve II-XII: Pupils were equal round reactive to light extraocular movements were full, visual field were full on confrontational test. facial sensation and strength were normal. hearing diminished mildly bilaterally. Uvula tongue midline. head turning and shoulder shrug and were normal and symmetric.Tongue protrusion into cheek strength was normal. MOTOR: normal bulk and tone, full strength in the BUE, BLE, fine finger movements normal, no pronator drift SENSORY: normal and symmetric to light touch, pinprick, temperature, vibration COORDINATION: finger-nose-finger normal, there was no truncal ataxia REFLEXES: 1+ and symmetric  GAIT/STATION: Rising up from seated position without assistance, stance is tooped, gait demonstrates short steps and mild imbalance. Unsteady on a narrow base and while walking in a straight line. Ambulates with a rolling walker.  DIAGNOSTIC DATA (LABS, IMAGING, TESTING) - I reviewed patient records, labs, notes, testing and imaging myself where available.  Lab Results  Component Value Date   WBC 9.9 10/23/2014   HGB 13.4 10/23/2014   HCT 39.7 10/23/2014   MCV 89.8 10/23/2014   PLT 285 10/23/2014      Component Value Date/Time   NA 135 10/23/2014 0825   K 3.5 10/23/2014 0825   CL 103 10/23/2014 0825   CO2 26 10/23/2014 0825   GLUCOSE 84 10/23/2014 0825   BUN 15 10/23/2014 0825   CREATININE 0.75 10/23/2014 0825   CREATININE 0.72 11/21/2012 0955   CALCIUM 8.8 10/23/2014 0825   PROT 7.5 11/21/2012 0955   ALBUMIN 4.0 11/21/2012 0955   AST 26 11/21/2012 0955   ALT 11 11/21/2012 0955   ALKPHOS 77 11/21/2012  0955   BILITOT 0.5 11/21/2012 0955   GFRNONAA 72* 10/23/2014 0825   GFRAA 83* 10/23/2014 0825   CT HEAD WITHOUT CONTRAST 11/21/12: Negative noncontrast head CT for age.  MR ANGIOGRAM NECK W Wo CONTRAST 12/21/12: This MRA of the neck shows no significant stenosis at either carotid bifurcation in the neck. Both vertebral arteries once antegrade flow with focal area of flow reduction at the origin which may be related to artifact which are common in this location versus focal stenosis.  MR MRA HEAD WO CONTRAST:  11/24/12 This MRA of the brain shows no significant stenosis of the large to medium-sized intracranial vessels.  MRI HEAD WITHOUT CONTRAST 11/23/12 Small (very small) subacute appearing cortically based infarct in the right superior frontal gyrus (right MCA territory, pre motor area).   No associated mass effect or hemorrhage. No other acute intracranial abnormality.  2D ECHO 12/10/12:  Left ventricle: The cavity size was normal. Wall thickness was increased in a pattern of mild LVH. Systolic function was normal. The estimated ejection fraction was in the range of 60% to 65%. Doppler parameters are consistent with abnormal left ventricular relaxation (grade 1 diastolic dysfunction). Aortic valve: Mild to moderate regurgitation. Mitral valve: Mild regurgitation.  ASSESSMENT AND PLAN  81 year old Caucasian lady with sudden onset bilateral painless visual hallucinations of unclear etiology possibilities include small bilateral occipital infarcts not visualized on MRI given MRI finding of a silent right frontal infarct. Strong suspicion for paroxysmal atrial fibrillation, could not tolerate Lif[employer] monitor, spoke to patient about loop recorder but she does not want it.  Even if paroxysmal atrial fibrillation was found, she and daughter have decided they do not want her to go on blood thinners stronger than aspirin. Recurrent episodes of transient vision disturbances possibly migraine  equivalents now responded to Depakote  PLAN:  I had a long discussion with the patient and her daughter about her episodes of transient vision dysfunctions which seem to have responded quite well to Depakote. Continue Depakote SR 500 mg twice daily. I also counseled the patient about fall and safety precautions and advise her to use a walker at all times. Return for follow-up in 6 months or call earlier if necessary   Delia Heady, MD  11/23/2014, 5:05 PM  [facility] [address], [address] [place], [place] [address] [phone]

## 2015-02-15 ENCOUNTER — Telehealth: Payer: Self-pay | Admitting: Neurology

## 2015-02-15 NOTE — Telephone Encounter (Signed)
Patients daughter called and stated that they believe the medication Rx. divalproex (DEPAKOTE ER) 250 MG 24 hr tablet is giving her negative side effects and they have requested to speak with the nurse regarding this. Please call and advise.

## 2015-02-16 NOTE — Telephone Encounter (Signed)
Spoke to patient. She says she is having hair loss (moderate since June), when speaking family states her lips are twisted, dry/sore throat and teeth chattering. Side effects started around April and are increasing. Patient went to PCP, Dr. Zachery Dauer recommended she address side effects with Dr. Pearlean Brownie. Advised patient will inform Dr. Pearlean Brownie for advice on scheduling f/u appt. Patient agreed.

## 2015-02-19 NOTE — Telephone Encounter (Signed)
Spoke to the patient. She was having hair loss and tremulousness possibly related to Depakote. Upon advice from her primary physician she has tapered and discontinued it and is doing better. I agree on stopping Depakote and asked her to call me back if episodes of vision dysfunction recur

## 2015-03-27 ENCOUNTER — Encounter: Payer: Self-pay | Admitting: Neurology

## 2015-03-27 ENCOUNTER — Ambulatory Visit (INDEPENDENT_AMBULATORY_CARE_PROVIDER_SITE_OTHER): Payer: Medicare Other | Admitting: Neurology

## 2015-03-27 VITALS — BP 100/60 | HR 65 | Ht <= 58 in | Wt 102.4 lb

## 2015-03-27 DIAGNOSIS — R251 Tremor, unspecified: Secondary | ICD-10-CM

## 2015-03-27 MED ORDER — ALPRAZOLAM 0.25 MG PO TABS: 0.2500 mg | ORAL_TABLET | Freq: Two times a day (BID) | ORAL | Status: DC

## 2015-03-27 NOTE — Progress Notes (Signed)
[facility]  PATIENT: Janet Brooks DOB: 1920-11-07   HISTORY FROM: patient, daughter REASON FOR VISIT: routine follow up  HISTORY OF PRESENT ILLNESS:  81 year old Caucasian lady who woke up on 11/21/12 with sudden onset of visual disturbances. She states that everything seemed red when she opened her eyes and also noticed some wavy lines as well as geometric designs in front of both eyes. It did not matter if she closed either eye. This lasted all day and even when she was sleeping with eyes closed this was bothering her. She was able to sleep after taking Ambien. This seems to be improving slightly for the last couple of days. She denied any headache, double vision, vertigo, focal extremity weakness numbness or increasing gait or balance problems. She has no prior history of strokes, TIAs or significant neurological problems. She does admit to have been having dizzy episodes for the last 1 month. She describes this as a feeling of swimmy headedness with imbalance and needing to hold onto something to avoid falling. She denies true vertigo. This may last for days. She had prior history of dizzy spells 8 years ago for which she was evaluated by a neurolog[place] [facility] [place] and I do not have any of those records but as per the patient all workup was negative. She was even evaluated by ENT physician at that time and no clear explanation was found. She denies any history of ablation but does feel palpitations from time to time. She had CT scan of the head done on 11/21/12 which was unremarkable an MRI scan of the brain done yesterday reviewed personally by me shows a tiny right frontal cortical infarct and a tiny chronic age right cerebellar lacunar infarct. The moderate changes of age-related chronic microvascular disease and mild effusion with cerebral atrophy. She is a independent 81 year old who lives alone with herself. She has chronic back pain and walks with a wheeled walker and  states her balance is not good. She has not had any significant recent falls.  UPDATE: 02/02/13:  Ms. Janet Brooks returns for follow up since visit on 11/24/12.  She has had no further visual disturbances since her last visit.  MRI showed tiny right frontal cortical infarct and a tiny chronic age right cerebellar lacunar infarct, but nothing to explain the visual disturbances.  She had cardiac event monitor which she wore for 9 hours, could not stand it, had to take off, she has sent it back.  She and daughter state that she would not want loop recorder because she does not want to be on strong blood thinners.  She had TTE done, which was normal.  She has not had EEG done.   Overall she is doing well, but very stressed because son is going through Stage III liver cancer and she is not sleeping well.  She has Rx for Xanax from PCP but does not want to use it.  She reports no falls at home, uses rolling walker.  She is taking Aspirin 325mg  daily and has no bleeding or bruising. Update 08/15/2014 : She is seen today upon request from primary care physician because of recurrent episodes of vision distortion, single red dots as well as [name] metric shapes off and on. This has been occurring once or twice a month. She however has history of these episodes and has been evaluated by me in the past for this. These episodes can last for several days and often triggered by anxiety and stress. She was  seen by to ophthalmologist who did not find any eye abnormalities to explain the symptoms. She denies any prior history of migraines or headaches before or during these symptoms. She had carotid ultrasound done a month and a half ago which showed no significant extracranial stenosis. She was admitted in fact a few months ago to the hospital for anxiety and panic symptoms. She is significantly bothered by severe back pain from spinal stenosis and has walking difficulties and her legs intermittently feel numb.   Update 11/23/2014 : She  returns for follow-up after last visit 3 months ago. She states that the Depakote seems to help significantly she is having less frequent headaches as well as knots having visual disturbances. She is tolerating 500 mg twice daily without significant side effects. She had an EEG done on 08/31/14 which I personally reviewed was normal without definite epileptiform activity. She was hospitalized the last month ago for episode of asthmatic bronchitis and dysphagia and is currently on tapering prednisone dose and is feeling better. She still needs some of her pills to be crushed. She has no other new complaints. Update 03/27/2015 : She returns for follow-up after last visit 4 months ago. She had to discontinue Depakote due to had loss but has noticed that she's had one episode of vision disturbance in the last 6 weeks after stopping it. She also had tremulousness which has not improved after stopping Depakote. She in fact complains of intermittent episodes of teeth chattering as well as hand tremors following an admission for bronchitis. She does take exam next intermittently as needed which seems to help. She admits to being under increased anxiety recently. She remains on Inderal LA 80 mg and seems to be tolerating it well. REVIEW OF SYSTEMS: Full 14 system review of systems performed and notable only for:   Tremors, trouble swallowing, had loss and all other systems negative   ALLERGIES: Allergies  Allergen Reactions  . Codeine Itching and Rash  . Ultram [Tramadol Hcl] Rash    HOME MEDICATIONS: Outpatient Prescriptions Prior to Visit  Medication Sig Dispense Refill  . Cholecalciferol (VITAMIN D3) 2000 UNITS TABS Take by mouth.    . dorzolamide (TRUSOPT) 2 % ophthalmic solution Place 1 drop into both eyes every 12 (twelve) hours.      . gabapentin (NEURONTIN) 100 MG capsule Take 300 mg by mouth every 8 (eight) hours.     Marland Kitchen guaifenesin (ROBITUSSIN) 100 MG/5ML syrup Take 10 mLs (200 mg total) by mouth 4  (four) times daily. 120 mL 0  . ipratropium-albuterol (DUONEB) 0.5-2.5 (3) MG/3ML SOLN Take 3 mLs by nebulization every 4 (four) hours as needed. 360 mL 12  . lisinopril (PRINIVIL,ZESTRIL) 20 MG tablet Take 20 mg by mouth daily.  0  . pantoprazole (PROTONIX) 40 MG tablet Take 1 tablet (40 mg total) by mouth 2 (two) times daily. 60 tablet 6  . propranolol (INDERAL LA) 80 MG 24 hr capsule Take 80 mg by mouth daily.      . sertraline (ZOLOFT) 25 MG tablet   11  . timolol (BETIMOL) 0.5 % ophthalmic solution Place 1 drop into both eyes every 12 (twelve) hours.      Marland Kitchen zolpidem (AMBIEN) 10 MG tablet Take 10 mg by mouth at bedtime.     . ALPRAZolam (XANAX) 0.25 MG tablet Take 0.25-0.5 mg by mouth daily as needed for anxiety.    Marland Kitchen amLODipine (NORVASC) 10 MG tablet     . benzonatate (TESSALON) 100 MG capsule Take 1  capsule (100 mg total) by mouth 2 (two) times daily as needed for cough. (Patient not taking: Reported on 03/27/2015) 20 capsule 0  . divalproex (DEPAKOTE ER) 250 MG 24 hr tablet Take 2 tablets (500 mg total) by mouth daily. Start one tablet daily for 2 weeks then 2 tablets daily (Patient not taking: Reported on 03/27/2015) 60 tablet 3  . docusate sodium (COLACE) 100 MG capsule Take 1 capsule (100 mg total) by mouth 2 (two) times daily. (Patient not taking: Reported on 03/27/2015) 10 capsule 0   No facility-administered medications prior to visit.    PAST MEDICAL HISTORY: Past Medical History  Diagnosis Date  . Glaucoma   . Spinal stenosis   . Hypertension   . Asthmatic bronchitis     "allergy bronchitis"    PAST SURGICAL HISTORY: Past Surgical History  Procedure Laterality Date  . Partial hysterectomy    . Back surgery      FAMILY HISTORY: Family History  Problem Relation Age of Onset  . Stroke Mother     SOCIAL HISTORY: Social History   Social History  . Marital Status: Widowed    Spouse Name: N/A  . Number of Children: 3  . Years of Education: 12   Occupational  History  . Not on file.   Social History Main Topics  . Smoking status: Never Smoker   . Smokeless tobacco: Never Used  . Alcohol Use: No  . Drug Use: No  . Sexual Activity: Not on file   Other Topics Concern  . Not on file   Social History Narrative   Patient is widowed with 3 children.   Patient has hs education.   Patient drinks 1 cup daily.   Patient is right handed.     PHYSICAL EXAM  Filed Vitals:   03/27/15 1550  BP: 100/60  Pulse: 65  Height: 4\' 1"  (1.245 m)  Weight: 102 lb 6.4 oz (46.448 kg)   Body mass index is 29.97 kg/(m^2).  Generalized: In no acute distress, pleasant  Frail elderly Caucasian female.   Neck: Supple, no carotid bruits   Cardiac: Regular rate rhythm, soft systolic murmur   Pulmonary: Clear to auscultation bilaterally   Musculoskeletal: moderate kyphoscoliosis   Neurological examination   Mentation: Alert oriented to time, place, history taking, language fluent, and causual conversation  Cranial nerve II-XII: Pupils were equal round reactive to light extraocular movements were full, visual field were full on confrontational test. facial sensation and strength were normal. hearing diminished mildly bilaterally. Uvula tongue midline. head turning and shoulder shrug and were normal and symmetric.Tongue protrusion into cheek strength was normal. MOTOR: normal bulk and tone, full strength in the BUE, BLE, fine finger movements normal, no pronator drift. No tremor noted SENSORY: normal and symmetric to light touch, pinprick, temperature, vibration COORDINATION: finger-nose-finger normal, there was no truncal ataxia REFLEXES: 1+ and symmetric  GAIT/STATION: Rising up from seated position without assistance, stance is tooped, gait demonstrates short steps and mild imbalance. Unsteady on a narrow base and while walking in a straight line. Ambulates with a rolling walker.  DIAGNOSTIC DATA (LABS, IMAGING, TESTING) - I reviewed patient records,  labs, notes, testing and imaging myself where available.  Lab Results  Component Value Date   WBC 9.9 10/23/2014   HGB 13.4 10/23/2014   HCT 39.7 10/23/2014   MCV 89.8 10/23/2014   PLT 285 10/23/2014      Component Value Date/Time   NA 135 10/23/2014 0825   K 3.5 10/23/2014  0825   CL 103 10/23/2014 0825   CO2 26 10/23/2014 0825   GLUCOSE 84 10/23/2014 0825   BUN 15 10/23/2014 0825   CREATININE 0.75 10/23/2014 0825   CREATININE 0.72 11/21/2012 0955   CALCIUM 8.8 10/23/2014 0825   PROT 7.5 11/21/2012 0955   ALBUMIN 4.0 11/21/2012 0955   AST 26 11/21/2012 0955   ALT 11 11/21/2012 0955   ALKPHOS 77 11/21/2012 0955   BILITOT 0.5 11/21/2012 0955   GFRNONAA 72* 10/23/2014 0825   GFRAA 83* 10/23/2014 0825   CT HEAD WITHOUT CONTRAST 11/21/12: Negative noncontrast head CT for age.  MR ANGIOGRAM NECK W Wo CONTRAST 12/21/12: This MRA of the neck shows no significant stenosis at either carotid bifurcation in the neck. Both vertebral arteries once antegrade flow with focal area of flow reduction at the origin which may be related to artifact which are common in this location versus focal stenosis.  MR MRA HEAD WO CONTRAST:  11/24/12 This MRA of the brain shows no significant stenosis of the large to medium-sized intracranial vessels.  MRI HEAD WITHOUT CONTRAST 11/23/12 Small (very small) subacute appearing cortically based infarct in the right superior frontal gyrus (right MCA territory, pre motor area).   No associated mass effect or hemorrhage. No other acute intracranial abnormality.  2D ECHO 12/10/12:  Left ventricle: The cavity size was normal. Wall thickness was increased in a pattern of mild LVH. Systolic function was normal. The estimated ejection fraction was in the range of 60% to 65%. Doppler parameters are consistent with abnormal left ventricular relaxation (grade 1 diastolic dysfunction). Aortic valve: Mild to moderate regurgitation. Mitral valve: Mild  regurgitation.  ASSESSMENT AND PLAN  81 year old Caucasian lady with sudden onset bilateral painless visual hallucinations of unclear etiology possibilities include small bilateral occipital infarcts not visualized on MRI given MRI finding of a silent right frontal infarct. Strong suspicion for paroxysmal atrial fibrillation,   Recurrent episodes of transient vision disturbances possibly migraine equivalents  responded to Depakote but could not tolerate it due to having loss. New complaints of hand tremulousness and teeth chattering likely related to anxiety  PLAN:  I had a long discussion with the patient and her daughter regarding her increasing tremors and teeth chattering which may be related to underlying anxiety. I recommend she take Xanax 0.25 mg twice daily on a trial basis for next 2 weeks and if she notices significant improvement continue follow-up with her primary physician and last for further prescriptions from him. If this is not effective may consider increasing Inderal LA dose from 80 to 120 mg particularly if she has recurrent episodes of ocular migraines.. She was also asked to start taking aspirin 81 mg daily with meals. Greater than 50% of time during this 25 minute visit was spent on counseling and coordination of care. She will return for follow-up in 6 months or call earlier if necessary.   Delia Heady, MD  03/27/2015, 5:45 PM  [facility] [address] [place], [place] [address] [phone]

## 2015-03-27 NOTE — Patient Instructions (Signed)
I had a long discussion with the patient and her daughter regarding her increasing tremors and teeth chattering which may be related to underlying anxiety. I recommend she take Xanax 0.25 mg twice daily on a trial basis for next 2 weeks and if she notices significant improvement continue follow-up with her primary physician and last for further prescriptions from him. If this is not effective may consider increasing Inderal LA dose from 80 to 120 mg particularly if she has recurrent episodes of ocular migraines.. She was also asked to start taking aspirin 81 mg daily with meals. She will return for follow-up in 6 months or call earlier if necessary.

## 2016-01-07 ENCOUNTER — Telehealth: Payer: Self-pay

## 2016-01-07 NOTE — Telephone Encounter (Signed)
Rn sent [facility] request for xanax for patient. Medication denied by Dr.Sethi, pt needs to seek PCP if she wants a refill. See Dr.Sethi last note.

## 2017-03-06 ENCOUNTER — Inpatient Hospital Stay (HOSPITAL_COMMUNITY)
Admission: EM | Admit: 2017-03-06 | Discharge: 2017-03-08 | DRG: 640 | Disposition: A | Discharge status: Hospice | Payer: Medicare Other | Attending: Internal Medicine | Admitting: Internal Medicine

## 2017-03-06 ENCOUNTER — Emergency Department (HOSPITAL_COMMUNITY): Payer: Medicare Other

## 2017-03-06 ENCOUNTER — Encounter (HOSPITAL_COMMUNITY): Payer: Self-pay

## 2017-03-06 DIAGNOSIS — R0603 Acute respiratory distress: Secondary | ICD-10-CM

## 2017-03-06 DIAGNOSIS — Z7951 Long term (current) use of inhaled steroids: Secondary | ICD-10-CM

## 2017-03-06 DIAGNOSIS — Z515 Encounter for palliative care: Secondary | ICD-10-CM | POA: Diagnosis not present

## 2017-03-06 DIAGNOSIS — E871 Hypo-osmolality and hyponatremia: Secondary | ICD-10-CM | POA: Diagnosis present

## 2017-03-06 DIAGNOSIS — N179 Acute kidney failure, unspecified: Secondary | ICD-10-CM

## 2017-03-06 DIAGNOSIS — M4800 Spinal stenosis, site unspecified: Secondary | ICD-10-CM | POA: Diagnosis present

## 2017-03-06 DIAGNOSIS — E43 Unspecified severe protein-calorie malnutrition: Secondary | ICD-10-CM | POA: Diagnosis present

## 2017-03-06 DIAGNOSIS — E538 Deficiency of other specified B group vitamins: Secondary | ICD-10-CM | POA: Diagnosis present

## 2017-03-06 DIAGNOSIS — F0390 Unspecified dementia without behavioral disturbance: Secondary | ICD-10-CM

## 2017-03-06 DIAGNOSIS — I10 Essential (primary) hypertension: Secondary | ICD-10-CM | POA: Diagnosis not present

## 2017-03-06 DIAGNOSIS — E860 Dehydration: Secondary | ICD-10-CM | POA: Diagnosis present

## 2017-03-06 DIAGNOSIS — R64 Cachexia: Secondary | ICD-10-CM | POA: Diagnosis present

## 2017-03-06 DIAGNOSIS — Z888 Allergy status to other drugs, medicaments and biological substances status: Secondary | ICD-10-CM

## 2017-03-06 DIAGNOSIS — E872 Acidosis: Principal | ICD-10-CM | POA: Diagnosis present

## 2017-03-06 DIAGNOSIS — R7989 Other specified abnormal findings of blood chemistry: Secondary | ICD-10-CM

## 2017-03-06 DIAGNOSIS — Z90710 Acquired absence of both cervix and uterus: Secondary | ICD-10-CM | POA: Diagnosis not present

## 2017-03-06 DIAGNOSIS — R000 Tachycardia, unspecified: Secondary | ICD-10-CM

## 2017-03-06 DIAGNOSIS — H409 Unspecified glaucoma: Secondary | ICD-10-CM | POA: Diagnosis present

## 2017-03-06 DIAGNOSIS — E876 Hypokalemia: Secondary | ICD-10-CM | POA: Diagnosis present

## 2017-03-06 DIAGNOSIS — R0789 Other chest pain: Secondary | ICD-10-CM | POA: Diagnosis present

## 2017-03-06 DIAGNOSIS — Z6820 Body mass index (BMI) 20.0-20.9, adult: Secondary | ICD-10-CM | POA: Diagnosis not present

## 2017-03-06 DIAGNOSIS — F419 Anxiety disorder, unspecified: Secondary | ICD-10-CM | POA: Diagnosis present

## 2017-03-06 DIAGNOSIS — R627 Adult failure to thrive: Secondary | ICD-10-CM | POA: Diagnosis present

## 2017-03-06 DIAGNOSIS — Z885 Allergy status to narcotic agent status: Secondary | ICD-10-CM

## 2017-03-06 DIAGNOSIS — R443 Hallucinations, unspecified: Secondary | ICD-10-CM | POA: Diagnosis present

## 2017-03-06 DIAGNOSIS — I119 Hypertensive heart disease without heart failure: Secondary | ICD-10-CM | POA: Diagnosis present

## 2017-03-06 DIAGNOSIS — R410 Disorientation, unspecified: Secondary | ICD-10-CM | POA: Diagnosis not present

## 2017-03-06 DIAGNOSIS — Z66 Do not resuscitate: Secondary | ICD-10-CM

## 2017-03-06 DIAGNOSIS — Z823 Family history of stroke: Secondary | ICD-10-CM

## 2017-03-06 LAB — VITAMIN B12: Vitamin B-12: 84 pg/mL — ABNORMAL LOW (ref 180–914)

## 2017-03-06 LAB — COMPREHENSIVE METABOLIC PANEL
ALT: 12 U/L — ABNORMAL LOW (ref 14–54)
ANION GAP: 11 (ref 5–15)
AST: 30 U/L (ref 15–41)
Albumin: 3.9 g/dL (ref 3.5–5.0)
Alkaline Phosphatase: 66 U/L (ref 38–126)
BILIRUBIN TOTAL: 0.9 mg/dL (ref 0.3–1.2)
BUN: 20 mg/dL (ref 6–20)
CO2: 24 mmol/L (ref 22–32)
Calcium: 9.6 mg/dL (ref 8.9–10.3)
Chloride: 106 mmol/L (ref 101–111)
Creatinine, Ser: 1.02 mg/dL — ABNORMAL HIGH (ref 0.44–1.00)
GFR calc non Af Amer: 46 mL/min — ABNORMAL LOW (ref >60)
GFR, EST AFRICAN AMERICAN: 53 mL/min — AB (ref >60)
Glucose, Bld: 118 mg/dL — ABNORMAL HIGH (ref 65–99)
POTASSIUM: 3.3 mmol/L — AB (ref 3.5–5.1)
Sodium: 141 mmol/L (ref 135–145)
Total Protein: 7.5 g/dL (ref 6.5–8.1)

## 2017-03-06 LAB — URINALYSIS, ROUTINE W REFLEX MICROSCOPIC
BACTERIA UA: NONE SEEN
Bilirubin Urine: NEGATIVE
Glucose, UA: NEGATIVE mg/dL
Hgb urine dipstick: NEGATIVE
KETONES UR: 20 mg/dL — AB
LEUKOCYTES UA: NEGATIVE
Nitrite: NEGATIVE
Protein, ur: 30 mg/dL — AB
SQUAMOUS EPITHELIAL / LPF: NONE SEEN
Specific Gravity, Urine: 1.015 (ref 1.005–1.030)
pH: 6 (ref 5.0–8.0)

## 2017-03-06 LAB — I-STAT CG4 LACTIC ACID, ED: Lactic Acid, Venous: 2.55 mmol/L (ref 0.5–1.9)

## 2017-03-06 LAB — CBC
HEMATOCRIT: 41.9 % (ref 36.0–46.0)
Hemoglobin: 13.8 g/dL (ref 12.0–15.0)
MCH: 30 pg (ref 26.0–34.0)
MCHC: 32.9 g/dL (ref 30.0–36.0)
MCV: 91.1 fL (ref 78.0–100.0)
Platelets: 166 10*3/uL (ref 150–400)
RBC: 4.6 MIL/uL (ref 3.87–5.11)
RDW: 14.6 % (ref 11.5–15.5)
WBC: 7.2 10*3/uL (ref 4.0–10.5)

## 2017-03-06 LAB — FOLATE: FOLATE: 10.1 ng/mL (ref >5.9)

## 2017-03-06 LAB — TSH: TSH: 1.922 u[IU]/mL (ref 0.350–4.500)

## 2017-03-06 MED ORDER — SODIUM CHLORIDE 0.9 % IV BOLUS (SEPSIS)
1000.0000 mL | Freq: Once | INTRAVENOUS | Status: AC
  Administered 2017-03-06: 1000 mL via INTRAVENOUS

## 2017-03-06 MED ORDER — SODIUM CHLORIDE 0.9 % IV SOLN
INTRAVENOUS | Status: DC
  Administered 2017-03-06: 20:00:00 via INTRAVENOUS

## 2017-03-06 NOTE — ED Notes (Signed)
Pt returned from CT °

## 2017-03-06 NOTE — ED Triage Notes (Signed)
Per EMS, Per Daughter's request EMS Called out to [facility] due to altered mental status, pt currently being treated for UTI. Pt was up walking with walker upon EMS arrival but pt's daughter states that she is not acting herself. Pt tachy at 120 and tachypneic at 30 RR's. BP 203/100. spo2 95% on RA. Disoriented and lives in memory care unit.

## 2017-03-06 NOTE — H&P (Addendum)
History and Physical    Janet Brooks KHT:[id number] DOB: [birth year] DOA: 03/06/2017  PCP: Orvis Brill, [facility]  Patient coming from: ALF, [facility]  Chief Complaint: Decreased PO intake.   HPI: Janet Brooks is a 83 y.o. female with medical history significant of spinal stenosis, HTN, glaucoma, asthma, insomnia who presents for mental status changes.  Family is present (daughter, granddaughter, son in law) who tell most of the history.  Patient was not responsive to voice, but did speak to her granddaughter and responded to the blood pressure cuff.  Family reports that the patient has had a slow decline in memory and ability to care for herself in the last year.  The family became unable to care for her at home and placed her in an ALF in February.  Over the last month, she has had a worsening with hallucinations, paranoia and delusions.  Over the last 2 weeks, this has escalated.  She is having decreased PO intake and fluid intake because she has been concerned that she was being poisoned.  She is wandering and speaking to imaginary friends. Some of her hallucinations are violent including seeing the murder of a small child.  Family reports her personality has become aggressive and angry which is a big changed for her.  She has had decreased medication intake.  I reviewed her medication log from the nursing home which showed she has not taken gabapentin, lisinopril, zoloft, ambien for at least the last 7-10 days.  She was started on risperdal at the ALF and took it twice in the last 24 hours.  She further has xanax for anxiety/tremor and took it twice today.  The family reports that a UC at the nursing home was + for infection and she was started on Bactrim (appeared that she had taken at least one dose based on MAR), but UA here did not show signs of infection.  Finally, family reports she has been having bilateral eye infections for the last couple of days, is getting drops only for glaucoma.     ED Course: In the ED, she was found to have a K of 3.3, Cr was up from 0.75 to 1.02, Lactic acid was 2.55 --> 1.58 with fluids.  B12 low at 84.  ESR 23, TSH 1.9. CXR with no acute abnormality.  CT head with chronic atrophy and no acute issue.    Review of Systems: As per HPI otherwise 10 point review of systems negative.    Past Medical History:  Diagnosis Date  . Asthmatic bronchitis    "allergy bronchitis"  . Glaucoma   . Hypertension   . Spinal stenosis     Past Surgical History:  Procedure Laterality Date  . BACK SURGERY    . PARTIAL HYSTERECTOMY     Reviewed with family.   reports that she has never smoked. She has never used smokeless tobacco. She reports that she does not drink alcohol or use drugs.  Allergies  Allergen Reactions  . Codeine Itching and Rash  . Ultram [Tramadol Hcl] Rash   Reviewed with family.  Family History  Problem Relation Age of Onset  . Stroke Mother      Prior to Admission medications   Medication Sig Start Date End Date Taking? Authorizing Provider  acetaminophen (TYLENOL) 500 MG tablet Take 500 mg by mouth 3 (three) times daily.   Yes [provider]  albuterol (PROVENTIL HFA;VENTOLIN HFA) 108 (90 Base) MCG/ACT inhaler Inhale 2 puffs into the lungs every  6 (six) hours as needed for wheezing or shortness of breath.   Yes [provider]  ALPRAZolam (XANAX) 0.25 MG tablet Take 1 tablet (0.25 mg total) by mouth 2 (two) times daily. Patient taking differently: Take 0.25 mg by mouth 2 (two) times daily as needed for anxiety.  03/27/15  Yes Garvin Fila, MD  cholecalciferol (VITAMIN D) 1000 units tablet Take 1,000 Units by mouth daily.   Yes [provider]  docusate sodium (COLACE) 100 MG capsule Take 100 mg by mouth daily.   Yes [provider]  dorzolamide (TRUSOPT) 2 % ophthalmic solution Place 1 drop into both eyes every 12 (twelve) hours.     Yes [provider]  fluticasone (FLONASE) 50  MCG/ACT nasal spray Place 1 spray into both nostrils 2 (two) times daily.   Yes [provider]  gabapentin (NEURONTIN) 100 MG capsule (not taking) Take 100 mg by mouth daily.    Yes [provider]  lisinopril (PRINIVIL,ZESTRIL) 20 MG tablet (not taking) Take 20 mg by mouth daily. 05/31/14  Yes [provider]  loratadine (CLARITIN) 10 MG tablet Take 10 mg by mouth daily.   Yes [provider]  ranitidine (ZANTAC) 150 MG tablet Take 150 mg by mouth at bedtime.   Yes [provider]  risperiDONE (RISPERDAL) 0.25 MG tablet Take 0.25 mg by mouth 2 (two) times daily.   Yes [provider]  sertraline (ZOLOFT) 100 MG tablet Take 100 mg by mouth daily. Take with 25 mg to equal 125 mg   Yes [provider]  sertraline (ZOLOFT) 25 MG tablet (not taking) Take 25 mg by mouth daily. Take with 100 mg to equal 125 mg 11/01/14  Yes [provider]  sulfamethoxazole-trimethoprim (BACTRIM DS,SEPTRA DS) 800-160 MG tablet Take 1 tablet by mouth 2 (two) times daily.   Yes [provider]  timolol (BETIMOL) 0.5 % ophthalmic solution Place 1 drop into both eyes every 12 (twelve) hours.     Yes [provider]  zolpidem (AMBIEN) 5 MG tablet Take 2.5 mg by mouth at bedtime.   Yes [provider]  guaifenesin (ROBITUSSIN) 100 MG/5ML syrup Take 10 mLs (200 mg total) by mouth 4 (four) times daily. Patient not taking: Reported on 03/06/2017 10/25/14   Reyne Dumas, MD  ipratropium-albuterol (DUONEB) 0.5-2.5 (3) MG/3ML SOLN Take 3 mLs by nebulization every 4 (four) hours as needed. Patient not taking: Reported on 03/06/2017 10/25/14   Reyne Dumas, MD  pantoprazole (PROTONIX) 40 MG tablet Take 1 tablet (40 mg total) by mouth 2 (two) times daily. Patient not taking: Reported on 03/06/2017 10/25/14   Reyne Dumas, MD    Physical Exam: Vitals:   03/06/17 2215 03/06/17 2330 03/06/17 2345 03/07/17 0000  BP: (!) 181/84 (!) 158/83 (!)  145/73 (!) 160/98  Pulse: (!) 118 (!) 121 (!) 55 63  Resp: (!) 25 (!) 25 (!) 33 (!) 28  Temp:      TempSrc:      SpO2: 95% 96% 96% 98%  Weight:      Height:        Constitutional: Elderly woman, lying in bed with eyes closed, did not respond to voice, but did respond to pain with withdrawal.  Vitals:   03/06/17 2215 03/06/17 2330 03/06/17 2345 03/07/17 0000  BP: (!) 181/84 (!) 158/83 (!) 145/73 (!) 160/98  Pulse: (!) 118 (!) 121 (!) 55 63  Resp: (!) 25 (!) 25 (!) 33 (!) 28  Temp:  TempSrc:      SpO2: 95% 96% 96% 98%  Weight:      Height:       Eyes: Crusting of eyelids, would not open eyes, actively resisted passive opening.  + erythema around eyes.  Opened eyes spontaneously once and appeared to have injected conjunctivae.   ENMT: Mucous membranes are dray. Posterior pharynx clear of any exudate or lesions. Neck: normal, supple, no masses.  Neck supple to active motion.  Respiratory: clear to auscultation bilaterally, no wheezing, no crackles. Normal respiratory effort.  Cardiovascular: tachycardic rate and regular rhythm, no murmurs / rubs / gallops. No extremity edema. 2+ pedal pulses. Varicosities on legs.  Abdomen: no tenderness, no masses palpated. No hepatosplenomegaly. +BS Musculoskeletal: no clubbing / cyanosis. She has significantly thin skin and varicosities Skin: no rashes, lesions, ulcers. Back without any ulcer.  Neurologic: Unable to fully assess.  She moved all of her limbs actively but not to direct command.   Psychiatric: She would not answer questions or respond to her name.  Withdrew from pain.  She did speak once to say "shut up Baker Janus" to her granddaughter (that is the correct name)   Labs on Admission: I have personally reviewed following labs and imaging studies  CBC:  Recent Labs Lab 03/06/17 2018  WBC 7.2  HGB 13.8  HCT 41.9  MCV 91.1  PLT 696   Basic Metabolic Panel:  Recent Labs Lab 03/06/17 2018  NA 141  K 3.3*  CL 106  CO2 24    GLUCOSE 118*  BUN 20  CREATININE 1.02*  CALCIUM 9.6   GFR: Estimated Creatinine Clearance: 16.1 mL/min (A) (by C-G formula based on SCr of 1.02 mg/dL (H)). Liver Function Tests:  Recent Labs Lab 03/06/17 2018  AST 30  ALT 12*  ALKPHOS 66  BILITOT 0.9  PROT 7.5  ALBUMIN 3.9   No results for input(s): LIPASE, AMYLASE in the last 168 hours. No results for input(s): AMMONIA in the last 168 hours. Coagulation Profile: No results for input(s): INR, PROTIME in the last 168 hours. Cardiac Enzymes: No results for input(s): CKTOTAL, CKMB, CKMBINDEX, TROPONINI in the last 168 hours. BNP (last 3 results) No results for input(s): PROBNP in the last 8760 hours. HbA1C: No results for input(s): HGBA1C in the last 72 hours. CBG: No results for input(s): GLUCAP in the last 168 hours. Lipid Profile: No results for input(s): CHOL, HDL, LDLCALC, TRIG, CHOLHDL, LDLDIRECT in the last 72 hours. Thyroid Function Tests:  Recent Labs  03/06/17 2232  TSH 1.922   Anemia Panel:  Recent Labs  03/06/17 2232  VITAMINB12 86*  FOLATE 10.1   Urine analysis:    Component Value Date/Time   COLORURINE YELLOW 03/06/2017 2145   APPEARANCEUR CLEAR 03/06/2017 2145   LABSPEC 1.015 03/06/2017 2145   PHURINE 6.0 03/06/2017 2145   GLUCOSEU NEGATIVE 03/06/2017 2145   HGBUR NEGATIVE 03/06/2017 2145   BILIRUBINUR NEGATIVE 03/06/2017 2145   KETONESUR 20 (A) 03/06/2017 2145   PROTEINUR 30 (A) 03/06/2017 2145   UROBILINOGEN 0.2 10/20/2014 2109   NITRITE NEGATIVE 03/06/2017 2145   LEUKOCYTESUR NEGATIVE 03/06/2017 2145    Radiological Exams on Admission: Ct Head Wo Contrast  Result Date: 03/06/2017 CLINICAL DATA:  Altered mental status, currently being treated for urinary tract infection. EXAM: CT HEAD WITHOUT CONTRAST TECHNIQUE: Contiguous axial images were obtained from the base of the skull through the vertex without intravenous contrast. COMPARISON:  03/30/2014 CT head FINDINGS: BRAIN: There is  mild sulcal and ventricular  prominence consistent with superficial and central atrophy. No intraparenchymal hemorrhage, mass effect nor midline shift. Periventricular and subcortical white matter hypodensities consistent with chronic small vessel ischemic disease are identified. No acute large vascular territory infarcts. No abnormal extra-axial fluid collections. Basal cisterns are not effaced and midline. VASCULAR: Moderate calcific atherosclerosis of the carotid siphons. SKULL: No skull fracture. No significant scalp soft tissue swelling. SINUSES/ORBITS: The mastoid air-cells are clear. The included paranasal sinuses are well-aerated.The included ocular globes and orbital contents are non-suspicious. OTHER: None. IMPRESSION: Chronic atrophy with periventricular small vessel ischemic disease. No acute intracranial abnormality. Electronically Signed   By: Ashley Royalty M.D.   On: 03/06/2017 22:40   Dg Chest Port 1 View  Result Date: 03/06/2017 CLINICAL DATA:  Altered mental status.  Weakness. EXAM: PORTABLE CHEST 1 VIEW COMPARISON:  10/20/2014. FINDINGS: Enlarged cardiac silhouette with an interval increase in size. Tortuous calcified thoracic aorta. Clear lungs. The lungs are mildly hyperexpanded with minimally prominent interstitial markings, without significant change. Diffuse osteopenia. IMPRESSION: No acute abnormality. Progressive cardiomegaly with stable mild changes of COPD. Electronically Signed   By: Claudie Revering M.D.   On: 03/06/2017 20:51    EKG: Independently reviewed. Sinus tachycardia, PVCs  Assessment/Plan Dehydration, mild AKI - Per report, Ms. Corbo has not been eating and drinking very well, likely causing some mild dehydration - She has received a 1 L bolus int he ED and started on IVF with NS at 75cc/hr - Monitor renal function - Tachycardia improved with fluids  AMS, paranoia, hallucinations - Appears she has been declining for about a year, but acute issues started a month ago  and greatly accelerated in last 2 weeks.  She has stopped taking some of her chronic medications, which included sertraline - withdrawal from this medication could cause AMS - Other possibilities include overmedication, but I reviewed her MAR from the ALF and she has been refusing many of her medications - Possibility of brain mass is ruled out with CT head - Possibility of acute worsening of dementia, possibly lewy body dementia given severity of hallucinations.  - Consider psychiatry or neurology evaluation in the AM - Strict bed rest, neuro checks - Repeat UA showed no infection, did not start Abx.  Treat acute conjunctivitis - She had no fever, no reported headaches, no nuchal rigidity. CT head normal.  I think encephalitis or meningitis is less likely.  - Very interestingly, she has B12 deficiency  B12 deficiency - B12 deficiency is associated with neuropsych symptoms.  Patients do not have to have anemia to have the cognitive changes.  - Will confirm diagnosis with homocysteine and MMA - If confirmed, she will need replacement, likely IV/IM due to her not taking in PO.   Hypokalemia - Replace IV as she is not taking oral medications right now.   Bilateral conjunctivitis - With the drainage and crusting of eyelids, will treat as bacterial with erythromycin ointment  ? UTI - based on report from ALF - Repeat UA normal here.  Would try to get records from ALF.  Did not start bactrim.   Spinal stenosis - Pain in the left leg is chronic, she is not taking her gabapentin (on a very low dose).  Monitor for pain.    HTN - She is not taking her lisinopril - Will place PRN IV hydralazine at a low dose for severely elevated BP.       DVT prophylaxis: Lovenox Code Status: DNR, confirmed with family Family Communication: Multiple family members  at bedside  Disposition Plan: Admit for further work up Consults called: None Admission status: Inpatient, med surg   Gilles Chiquito  MD [facility] Pager [phone]  If 7PM-7AM, please contact night-coverage [email or web address] Password [email or web address]  03/07/2017, 1:12 AM

## 2017-03-06 NOTE — ED Provider Notes (Signed)
[facility]-EMERGENCY DEPT Provider Note   CSN: [id number] Arrival date & time: 03/06/17  2013     History   Chief Complaint Chief Complaint  Patient presents with  . Recurrent UTI  . Altered Mental Status    HPI Janet Brooks is a 83 y.o. female.  Patient [facility] by EMS with altered mental status, generalized weakness.  Patient not verbally responsive to questions - level 5 caveat.  Report of recent starting tx for uti. Temp 99.7. No report of fall/trauma.    The history is provided by the patient and the EMS personnel. The history is limited by the condition of the patient.  Altered Mental Status      Past Medical History:  Diagnosis Date  . Asthmatic bronchitis    "allergy bronchitis"  . Glaucoma   . Hypertension   . Spinal stenosis     Patient Active Problem List   Diagnosis Date Noted  . COPD exacerbation (HCC) 10/20/2014  . Anxiety 10/20/2014  . Atypical chest pain 10/20/2014  . Spinal stenosis 10/20/2014  . Hyponatremia 10/20/2014  . Acute on chronic respiratory failure with hypoxia [facility])     Past Surgical History:  Procedure Laterality Date  . BACK SURGERY    . PARTIAL HYSTERECTOMY      OB History    No data available       Home Medications    Prior to Admission medications   Medication Sig Start Date End Date Taking? Authorizing Provider  ALPRAZolam (XANAX) 0.25 MG tablet Take 1 tablet (0.25 mg total) by mouth 2 (two) times daily. 03/27/15   Micki Riley, MD  Cholecalciferol (VITAMIN D3) 2000 UNITS TABS Take by mouth.    [provider]  dorzolamide (TRUSOPT) 2 % ophthalmic solution Place 1 drop into both eyes every 12 (twelve) hours.      [provider]  gabapentin (NEURONTIN) 100 MG capsule Take 300 mg by mouth every 8 (eight) hours.     [provider]  guaifenesin (ROBITUSSIN) 100 MG/5ML syrup Take 10 mLs (200 mg total) by mouth 4 (four) times daily. 10/25/14   Richarda Overlie, MD  ipratropium-albuterol  (DUONEB) 0.5-2.5 (3) MG/3ML SOLN Take 3 mLs by nebulization every 4 (four) hours as needed. 10/25/14   Richarda Overlie, MD  lisinopril (PRINIVIL,ZESTRIL) 20 MG tablet Take 20 mg by mouth daily. 05/31/14   [provider]  pantoprazole (PROTONIX) 40 MG tablet Take 1 tablet (40 mg total) by mouth 2 (two) times daily. 10/25/14   Richarda Overlie, MD  propranolol (INDERAL LA) 80 MG 24 hr capsule Take 80 mg by mouth daily.      [provider]  sertraline (ZOLOFT) 25 MG tablet  11/01/14   [provider]  timolol (BETIMOL) 0.5 % ophthalmic solution Place 1 drop into both eyes every 12 (twelve) hours.      [provider]  zolpidem (AMBIEN) 10 MG tablet Take 10 mg by mouth at bedtime.     [provider]    Family History Family History  Problem Relation Age of Onset  . Stroke Mother     Social History Social History  Substance Use Topics  . Smoking status: Never Smoker  . Smokeless tobacco: Never Used  . Alcohol use No     Allergies   Codeine and Ultram [tramadol hcl]   Review of Systems Review of Systems  Unable to perform ROS: Patient unresponsive  level 5 caveat   Physical Exam Updated Vital  Signs BP (!) 202/115 (BP Location: Right Arm)   Pulse (!) 116   Temp 99.7 F (37.6 C) (Axillary)   Resp 18   Ht 1.245 m (4\' 1" )   Wt 45.4 kg (100 lb)   SpO2 96%   BMI 29.28 kg/m   Physical Exam  Constitutional: She appears well-developed and well-nourished. No distress.  HENT:  Head: Atraumatic.  Mouth/Throat: Oropharynx is clear and moist.  Eyes: Pupils are equal, round, and reactive to light. Conjunctivae are normal. No scleral icterus.  Neck: Neck supple. No tracheal deviation present.  No stiffness or rigidity  Cardiovascular: Normal rate, regular rhythm, normal heart sounds and intact distal pulses.  Exam reveals no gallop and no friction rub.   No murmur heard. Pulmonary/Chest: Effort normal and breath sounds normal. No respiratory  distress.  Abdominal: Soft. Normal appearance and bowel sounds are normal. She exhibits no distension. There is no tenderness.  Genitourinary:  Genitourinary Comments: No cva tenderness  Musculoskeletal: She exhibits no edema.  Neurological:  Weak appearing, slow to respond. Moves bil ext purposefully.   Skin: Skin is warm and dry. No rash noted. She is not diaphoretic.  Psychiatric:  Slow to respond.   Nursing note and vitals reviewed.    ED Treatments / Results  Labs (all labs ordered are listed, but only abnormal results are displayed)      EKG  EKG Interpretation None       Radiology No results found.  Procedures Procedures (including critical care time)  Medications Ordered in ED Medications  0.9 %  sodium chloride infusion ( Intravenous New Bag/Given 03/06/17 2028)     Initial Impression / Assessment and Plan / ED Course  I have reviewed the triage vital signs and the nursing notes.  Pertinent labs & imaging results that were available during my care of the patient were reviewed by me and considered in my medical decision making (see chart for details).  Iv ns. Labs. Cultures.  Reviewed nursing notes and prior charts for additional history.   Family arrives, states in past month, progressive weakness, confusion/mental status changes, and now is not eating or drinking, refusing meds.   Pt lives in assisted living, has own apartment.  On exam is dehydrated appearing, is tachycardic, ketones in urine, aki on labs. .  Also w uncontrolled htn, and not taking meds, and mental status changes/confusion.  Family does report being told urine/urine cx at [facility] positive today - was to be started on abx today. Current ua without clear indication of infxn.   Will admit for iv hydration.    Final Clinical Impressions(s) / ED Diagnoses   Final diagnoses:  None    New Prescriptions New Prescriptions   No medications on file     Cathren LaineSteinl, Caly Pellum, MD 03/06/17  2227

## 2017-03-06 NOTE — ED Notes (Signed)
Patient transported to CT 

## 2017-03-07 DIAGNOSIS — R000 Tachycardia, unspecified: Secondary | ICD-10-CM

## 2017-03-07 DIAGNOSIS — R7989 Other specified abnormal findings of blood chemistry: Secondary | ICD-10-CM

## 2017-03-07 DIAGNOSIS — I10 Essential (primary) hypertension: Secondary | ICD-10-CM

## 2017-03-07 DIAGNOSIS — N179 Acute kidney failure, unspecified: Secondary | ICD-10-CM

## 2017-03-07 DIAGNOSIS — F0390 Unspecified dementia without behavioral disturbance: Secondary | ICD-10-CM

## 2017-03-07 DIAGNOSIS — E43 Unspecified severe protein-calorie malnutrition: Secondary | ICD-10-CM

## 2017-03-07 DIAGNOSIS — R627 Adult failure to thrive: Secondary | ICD-10-CM

## 2017-03-07 DIAGNOSIS — R410 Disorientation, unspecified: Secondary | ICD-10-CM

## 2017-03-07 DIAGNOSIS — E876 Hypokalemia: Secondary | ICD-10-CM

## 2017-03-07 LAB — CBC
HEMATOCRIT: 42.6 % (ref 36.0–46.0)
HEMOGLOBIN: 13.9 g/dL (ref 12.0–15.0)
MCH: 30 pg (ref 26.0–34.0)
MCHC: 32.6 g/dL (ref 30.0–36.0)
MCV: 91.8 fL (ref 78.0–100.0)
Platelets: 171 10*3/uL (ref 150–400)
RBC: 4.64 MIL/uL (ref 3.87–5.11)
RDW: 14.5 % (ref 11.5–15.5)
WBC: 6.7 10*3/uL (ref 4.0–10.5)

## 2017-03-07 LAB — BASIC METABOLIC PANEL
ANION GAP: 11 (ref 5–15)
BUN: 11 mg/dL (ref 6–20)
CHLORIDE: 104 mmol/L (ref 101–111)
CO2: 26 mmol/L (ref 22–32)
Calcium: 9.3 mg/dL (ref 8.9–10.3)
Creatinine, Ser: 0.83 mg/dL (ref 0.44–1.00)
GFR calc non Af Amer: 59 mL/min — ABNORMAL LOW (ref >60)
GLUCOSE: 124 mg/dL — AB (ref 65–99)
POTASSIUM: 3.5 mmol/L (ref 3.5–5.1)
SODIUM: 141 mmol/L (ref 135–145)

## 2017-03-07 LAB — LACTIC ACID, PLASMA
Lactic Acid, Venous: 1.8 mmol/L (ref 0.5–1.9)
Lactic Acid, Venous: 2.4 mmol/L (ref 0.5–1.9)
Lactic Acid, Venous: 1.6 mmol/L (ref 0.5–1.9)

## 2017-03-07 LAB — I-STAT CG4 LACTIC ACID, ED: LACTIC ACID, VENOUS: 1.58 mmol/L (ref 0.5–1.9)

## 2017-03-07 LAB — MRSA PCR SCREENING: MRSA BY PCR: NEGATIVE

## 2017-03-07 LAB — RPR: RPR: NONREACTIVE

## 2017-03-07 LAB — GLUCOSE, CAPILLARY: Glucose-Capillary: 112 mg/dL — ABNORMAL HIGH (ref 65–99)

## 2017-03-07 LAB — SEDIMENTATION RATE: SED RATE: 23 mm/h — AB (ref 0–22)

## 2017-03-07 MED ORDER — KETOROLAC TROMETHAMINE 15 MG/ML IJ SOLN: 7.5000 mg | Freq: Three times a day (TID) | INTRAMUSCULAR | Status: DC | PRN

## 2017-03-07 MED ORDER — ONDANSETRON HCL 4 MG/2ML IJ SOLN: 4.0000 mg | Freq: Four times a day (QID) | INTRAMUSCULAR | Status: DC | PRN

## 2017-03-07 MED ORDER — DOCUSATE SODIUM 100 MG PO CAPS
100.0000 mg | ORAL_CAPSULE | Freq: Every day | ORAL | Status: DC
  Filled 2017-03-07: qty 1

## 2017-03-07 MED ORDER — TIMOLOL MALEATE 0.5 % OP SOLN
1.0000 [drp] | Freq: Two times a day (BID) | OPHTHALMIC | Status: DC
  Administered 2017-03-07 – 2017-03-08 (×2): 1 [drp] via OPHTHALMIC
  Filled 2017-03-07: qty 5

## 2017-03-07 MED ORDER — SODIUM CHLORIDE 0.9% FLUSH
3.0000 mL | Freq: Two times a day (BID) | INTRAVENOUS | Status: DC
  Administered 2017-03-07 (×2): 3 mL via INTRAVENOUS

## 2017-03-07 MED ORDER — METOPROLOL TARTRATE 5 MG/5ML IV SOLN
2.5000 mg | Freq: Three times a day (TID) | INTRAVENOUS | Status: DC
  Administered 2017-03-07 – 2017-03-08 (×2): 2.5 mg via INTRAVENOUS
  Filled 2017-03-07 (×2): qty 5

## 2017-03-07 MED ORDER — SODIUM CHLORIDE 0.9 % IV SOLN
INTRAVENOUS | Status: AC
  Administered 2017-03-07: 02:00:00 via INTRAVENOUS

## 2017-03-07 MED ORDER — ERYTHROMYCIN 5 MG/GM OP OINT
TOPICAL_OINTMENT | Freq: Four times a day (QID) | OPHTHALMIC | Status: DC
  Administered 2017-03-07 – 2017-03-08 (×5): via OPHTHALMIC
  Filled 2017-03-07: qty 3.5

## 2017-03-07 MED ORDER — CYANOCOBALAMIN 1000 MCG/ML IJ SOLN
1000.0000 ug | Freq: Every day | INTRAMUSCULAR | Status: DC
  Filled 2017-03-07 (×2): qty 1

## 2017-03-07 MED ORDER — FLUTICASONE PROPIONATE 50 MCG/ACT NA SUSP
1.0000 | Freq: Two times a day (BID) | NASAL | Status: DC
  Administered 2017-03-07 – 2017-03-08 (×2): 1 via NASAL
  Filled 2017-03-07: qty 16

## 2017-03-07 MED ORDER — ALBUTEROL SULFATE (2.5 MG/3ML) 0.083% IN NEBU: 3.0000 mL | INHALATION_SOLUTION | Freq: Four times a day (QID) | RESPIRATORY_TRACT | Status: DC | PRN

## 2017-03-07 MED ORDER — BOOST / RESOURCE BREEZE PO LIQD: 1.0000 | Freq: Three times a day (TID) | ORAL | Status: DC

## 2017-03-07 MED ORDER — DIPHENHYDRAMINE HCL 50 MG/ML IJ SOLN: 12.5000 mg | Freq: Three times a day (TID) | INTRAMUSCULAR | Status: DC | PRN

## 2017-03-07 MED ORDER — IPRATROPIUM BROMIDE 0.02 % IN SOLN: 0.5000 mg | Freq: Two times a day (BID) | RESPIRATORY_TRACT | Status: DC

## 2017-03-07 MED ORDER — LORATADINE 10 MG PO TABS
10.0000 mg | ORAL_TABLET | Freq: Every day | ORAL | Status: DC
  Filled 2017-03-07: qty 1

## 2017-03-07 MED ORDER — LEVALBUTEROL HCL 0.63 MG/3ML IN NEBU: 0.6300 mg | INHALATION_SOLUTION | RESPIRATORY_TRACT | Status: DC | PRN

## 2017-03-07 MED ORDER — ALPRAZOLAM 0.25 MG PO TABS: 0.2500 mg | ORAL_TABLET | Freq: Two times a day (BID) | ORAL | Status: DC | PRN

## 2017-03-07 MED ORDER — LEVALBUTEROL HCL 0.63 MG/3ML IN NEBU: 0.6300 mg | INHALATION_SOLUTION | Freq: Two times a day (BID) | RESPIRATORY_TRACT | Status: DC

## 2017-03-07 MED ORDER — POTASSIUM CHLORIDE 10 MEQ/100ML IV SOLN
10.0000 meq | INTRAVENOUS | Status: AC
  Administered 2017-03-07 (×3): 10 meq via INTRAVENOUS
  Filled 2017-03-07 (×2): qty 100

## 2017-03-07 MED ORDER — ENOXAPARIN SODIUM 30 MG/0.3ML ~~LOC~~ SOLN
30.0000 mg | SUBCUTANEOUS | Status: DC
  Filled 2017-03-07: qty 0.3

## 2017-03-07 MED ORDER — SODIUM CHLORIDE 0.9 % IV BOLUS (SEPSIS)
500.0000 mL | Freq: Once | INTRAVENOUS | Status: AC
  Administered 2017-03-07: 500 mL via INTRAVENOUS

## 2017-03-07 MED ORDER — IPRATROPIUM BROMIDE 0.02 % IN SOLN: 0.5000 mg | RESPIRATORY_TRACT | Status: DC | PRN

## 2017-03-07 MED ORDER — LISINOPRIL 20 MG PO TABS
20.0000 mg | ORAL_TABLET | Freq: Every day | ORAL | Status: DC
  Filled 2017-03-07: qty 1

## 2017-03-07 MED ORDER — FAMOTIDINE 20 MG PO TABS
10.0000 mg | ORAL_TABLET | Freq: Every day | ORAL | Status: DC
  Filled 2017-03-07: qty 1

## 2017-03-07 MED ORDER — LORAZEPAM 2 MG/ML IJ SOLN
0.5000 mg | INTRAMUSCULAR | Status: DC | PRN
  Administered 2017-03-07: 0.5 mg via INTRAVENOUS
  Filled 2017-03-07: qty 1

## 2017-03-07 MED ORDER — POTASSIUM CHLORIDE 10 MEQ/100ML IV SOLN
10.0000 meq | INTRAVENOUS | Status: AC
  Administered 2017-03-07 – 2017-03-08 (×4): 10 meq via INTRAVENOUS
  Filled 2017-03-07 (×2): qty 100

## 2017-03-07 MED ORDER — DORZOLAMIDE HCL 2 % OP SOLN
1.0000 [drp] | Freq: Two times a day (BID) | OPHTHALMIC | Status: DC
  Administered 2017-03-07 (×2): 1 [drp] via OPHTHALMIC
  Filled 2017-03-07: qty 10

## 2017-03-07 MED ORDER — ACETAMINOPHEN 500 MG PO TABS
500.0000 mg | ORAL_TABLET | Freq: Three times a day (TID) | ORAL | Status: DC
  Filled 2017-03-07: qty 1

## 2017-03-07 MED ORDER — BISACODYL 10 MG RE SUPP
10.0000 mg | Freq: Every day | RECTAL | Status: DC
Start: 2017-03-07 — End: 2017-03-08
  Filled 2017-03-07 (×2): qty 1

## 2017-03-07 MED ORDER — SODIUM CHLORIDE 0.9 % IV SOLN
INTRAVENOUS | Status: AC
  Administered 2017-03-07: 17:00:00 via INTRAVENOUS

## 2017-03-07 MED ORDER — FAMOTIDINE IN NACL 20-0.9 MG/50ML-% IV SOLN
20.0000 mg | INTRAVENOUS | Status: DC
  Administered 2017-03-08: 20 mg via INTRAVENOUS
  Filled 2017-03-07 (×2): qty 50

## 2017-03-07 MED ORDER — HYDRALAZINE HCL 20 MG/ML IJ SOLN: 5.0000 mg | INTRAMUSCULAR | Status: DC | PRN

## 2017-03-07 NOTE — Progress Notes (Signed)
Pt arrived to 5w20. Grandaughter at bedside. Pt does not fallow commands, responsive to voice, but very confused. Unable to hold conversation. Low bed utilized for pt safety, call bell with in reach, but pt unable to understand purpose of it. Continuous pulse ox and bed alarm in place. Hygiene completed, pt combative. Pure wick placed, pt incontinent.  Will continue to monitor and treat pt per MD orders.

## 2017-03-07 NOTE — Progress Notes (Signed)
PROGRESS NOTE  Janet Brooks AVW:[id number] DOB: 1921-07-03 DOA: 03/06/2017 PCP: [facility]  HPI/Recap of past 24 hours:  Moaning during blood draw, does not open eyes, does not follow command, tamx 99.7  Assessment/Plan: Active Problems:   Atypical chest pain   Spinal stenosis   Hyponatremia   Dehydration   AKI (acute kidney injury) (HCC)   Confusion   Elevated lactic acid level   Progressive dementia with uncertain etiology  FTT from progressive dementia vs acute illness Per family patient has gradual decline for the past two yrs, for the past several weeks she has significantly worsening of altered mental status with paranoid and hallucinations, she stopped eating and taking her meds for the last two weeks.  So far ua/cxr unremarkable, no diarrhea, no identifiable infection, wbc wnl, blood culture in process. MRSa screening negative. ths wnl, rpr negative. Ct head no acute findings.  I have explained to the family , we will search and correct treatable acute illness, if patient does not improve, likely will need to proceed with hospice, family understand and agreed to meet with palliative care.  Patient is currently remain strict npo, continue ivf, she is significantly dehydrated and severe malnourished (significant fat depletion and muscle wasting )underweight Body mass index is 20.2 kg/m.  ( I have explained to the family that permanent feeding tube will not improve quality of life , family expressed understanding)  On prn iv ativan and iv benadryl for agitation, will try to avoid haldol for now due to qtc prolonged at 483. Repeat ekg in am to monitor qtc.  Lactic acidosis presented on admission has improved with hydration  Hypokalemia: replace k through iv, check mag  b12 deficiency:start  im b12 injection  HTN/tachycardia: on iv lopressor with holding parameters  H/o asthmatic bronchitis: currently no wheezing, no hypoxia, cxr with stable mild copd  changes, start nebs bid.    Code Status: DNR  Family Communication: patient and family at bedside  Disposition Plan: pending   Consultants:  Palliative care  Procedures:  none  Antibiotics:  none   Objective: BP (!) 175/87 (BP Location: Left Arm)   Pulse (!) 55   Temp 98.4 F (36.9 C) (Oral)   Resp 20   Ht 4\' 1"  (1.245 m)   Wt 45.4 kg (100 lb)   SpO2 99%   BMI 29.28 kg/m   Intake/Output Summary (Last 24 hours) at 03/07/17 0853 Last data filed at 03/07/17 0400  Gross per 24 hour  Intake           383.75 ml  Output                2 ml  Net           381.75 ml   Filed Weights   03/06/17 2017  Weight: 45.4 kg (100 lb)    Exam: Patient is examined daily including today on 03/07/2017, exam remain the same as of yesterday except that is highlighted below   General:  Frail, cachectic, not open eyes, moaning when touched, not follow commands  Cardiovascular: tacycardia  Respiratory: CTABL  Abdomen: Soft/ND/NT, positive BS  Musculoskeletal: No Edema  Neuro: not open eyes, moaning when touched, not follow commands  Data Reviewed: Basic Metabolic Panel:  Recent Labs Lab 03/06/17 2018 03/07/17 0209  NA 141 141  K 3.3* 3.5  CL 106 104  CO2 24 26  GLUCOSE 118* 124*  BUN 20 11  CREATININE 1.02* 0.83  CALCIUM 9.6 9.3  Liver Function Tests:  Recent Labs Lab 03/06/17 2018  AST 30  ALT 12*  ALKPHOS 66  BILITOT 0.9  PROT 7.5  ALBUMIN 3.9   No results for input(s): LIPASE, AMYLASE in the last 168 hours. No results for input(s): AMMONIA in the last 168 hours. CBC:  Recent Labs Lab 03/06/17 2018 03/07/17 0209  WBC 7.2 6.7  HGB 13.8 13.9  HCT 41.9 42.6  MCV 91.1 91.8  PLT 166 171   Cardiac Enzymes:   No results for input(s): CKTOTAL, CKMB, CKMBINDEX, TROPONINI in the last 168 hours. BNP (last 3 results) No results for input(s): BNP in the last 8760 hours.  ProBNP (last 3 results) No results for input(s): PROBNP in the last 8760  hours.  CBG: No results for input(s): GLUCAP in the last 168 hours.  No results found for this or any previous visit (from the past 240 hour(s)).   Studies: Ct Head Wo Contrast  Result Date: 03/06/2017 CLINICAL DATA:  Altered mental status, currently being treated for urinary tract infection. EXAM: CT HEAD WITHOUT CONTRAST TECHNIQUE: Contiguous axial images were obtained from the base of the skull through the vertex without intravenous contrast. COMPARISON:  03/30/2014 CT head FINDINGS: BRAIN: There is mild sulcal and ventricular prominence consistent with superficial and central atrophy. No intraparenchymal hemorrhage, mass effect nor midline shift. Periventricular and subcortical white matter hypodensities consistent with chronic small vessel ischemic disease are identified. No acute large vascular territory infarcts. No abnormal extra-axial fluid collections. Basal cisterns are not effaced and midline. VASCULAR: Moderate calcific atherosclerosis of the carotid siphons. SKULL: No skull fracture. No significant scalp soft tissue swelling. SINUSES/ORBITS: The mastoid air-cells are clear. The included paranasal sinuses are well-aerated.The included ocular globes and orbital contents are non-suspicious. OTHER: None. IMPRESSION: Chronic atrophy with periventricular small vessel ischemic disease. No acute intracranial abnormality. Electronically Signed   By: Tollie Ethavid  Kwon M.D.   On: 03/06/2017 22:40   Dg Chest Port 1 View  Result Date: 03/06/2017 CLINICAL DATA:  Altered mental status.  Weakness. EXAM: PORTABLE CHEST 1 VIEW COMPARISON:  10/20/2014. FINDINGS: Enlarged cardiac silhouette with an interval increase in size. Tortuous calcified thoracic aorta. Clear lungs. The lungs are mildly hyperexpanded with minimally prominent interstitial markings, without significant change. Diffuse osteopenia. IMPRESSION: No acute abnormality. Progressive cardiomegaly with stable mild changes of COPD. Electronically Signed    By: Beckie SaltsSteven  Reid M.D.   On: 03/06/2017 20:51    Scheduled Meds: . acetaminophen  500 mg Oral TID  . cyanocobalamin  1,000 mcg Intramuscular Daily  . docusate sodium  100 mg Oral Daily  . dorzolamide  1 drop Both Eyes Q12H  . enoxaparin (LOVENOX) injection  30 mg Subcutaneous Q24H  . erythromycin   Both Eyes Q6H  . famotidine  10 mg Oral Daily  . feeding supplement  1 Container Oral TID BM  . fluticasone  1 spray Each Nare BID  . lisinopril  20 mg Oral Daily  . loratadine  10 mg Oral Daily  . sodium chloride flush  3 mL Intravenous Q12H  . timolol  1 drop Both Eyes Q12H    Continuous Infusions: . sodium chloride 75 mL/hr at 03/07/17 0133     Time spent: 35mins I have personally reviewed and interpreted daily labs, tele strips, imagings as discussed above under date review session and assessment and plans. Obtained and reviewed prior hospital records D[facility]  Milfred Krammes MD, PhD  [facility] Pager [phone].  If 7PM-7AM, please contact night-coverage at [email or web address], password [facility] 03/07/2017, 8:53 AM  LOS: 1 day

## 2017-03-07 NOTE — Progress Notes (Signed)
Dr. Antionette Charpyd notified that patient was coughing up green mucous frequently; pt had fever of 99.7, and sustaining HR in 120s-130s.  New order given for chest xray.  2200 scheduled dose of metoprolol decreased HR to 110s.  Patient is now resting comfortably.  Will continue to monitor.

## 2017-03-07 NOTE — Progress Notes (Addendum)
Initial Nutrition Assessment  DOCUMENTATION CODES:   Not applicable  INTERVENTION:    Rec Speech Path consult for swallow evaluation prior to PO diet advancement   RD to follow for nutrition care plan, add interventions accordinlgy  NUTRITION DIAGNOSIS:   Inadequate oral intake related to  (AMS, paranoia, hallucinations) as evidenced by NPO status  GOAL:   Patient will meet greater than or equal to 90% of their needs  MONITOR:   Diet advancement, PO intake, Labs, Weight trends, Skin, I & O's  REASON FOR ASSESSMENT:   Malnutrition Screening Tool  ASSESSMENT:   83 y.o. female with medical history significant of spinal stenosis, HTN, glaucoma, asthma, insomnia who presents for mental status changes.  Pt very restless in bed. Moving all extremities. No family at bedside. Per chart review, pt with decreased PO intake, AMS and generalized weakness. Pt currently NPO. Has history of CVA and dysphagia. Speech Path following during admission in 2016. Noted pt with Vitamin B12 deficiency. Workup for etiology of AMS, paranoia and hallucinations ongoing. Labs and medications reviewed. CBG 112.  Unable to complete Nutrition-Focused physical exam at this time.  Suspect possible malnutrition.  Diet Order:  Diet NPO time specified Except for: Sips with Meds  Skin:  Wound (see comment) (Stage I to heel)  Last BM:  PTA  Height:   Ht Readings from Last 1 Encounters:  03/07/17 4\' 11"  (1.499 m)    Weight:   Wt Readings from Last 1 Encounters:  03/07/17 100 lb (45.4 kg)    Ideal Body Weight:  44.6 kg  BMI:  Body mass index is 20.2 kg/m.  Estimated Nutritional Needs:   Kcal:  1100-1300  Protein:  55-70 gm  Fluid:  >/= 1.5 L  EDUCATION NEEDS:   No education needs identified at this time  Maureen ChattersKatie Everley Evora, RD, LDN Pager #: [phone] After-Hours Pager #: [phone]

## 2017-03-07 NOTE — Progress Notes (Signed)
CRITICAL VALUE ALERT  Critical Value:  Lactic acid 2.4  Date & Time Notied:  03/07/17 at 0556  Provider Notified: Clearence PedSchorr, NP  Orders Received/Actions taken: New order placed

## 2017-03-08 ENCOUNTER — Inpatient Hospital Stay (HOSPITAL_COMMUNITY): Payer: Medicare Other

## 2017-03-08 DIAGNOSIS — R627 Adult failure to thrive: Secondary | ICD-10-CM

## 2017-03-08 DIAGNOSIS — Z66 Do not resuscitate: Secondary | ICD-10-CM

## 2017-03-08 DIAGNOSIS — R443 Hallucinations, unspecified: Secondary | ICD-10-CM

## 2017-03-08 DIAGNOSIS — E871 Hypo-osmolality and hyponatremia: Secondary | ICD-10-CM

## 2017-03-08 DIAGNOSIS — Z515 Encounter for palliative care: Secondary | ICD-10-CM

## 2017-03-08 LAB — BASIC METABOLIC PANEL
Anion gap: 12 (ref 5–15)
BUN: 9 mg/dL (ref 6–20)
CHLORIDE: 106 mmol/L (ref 101–111)
CO2: 19 mmol/L — AB (ref 22–32)
CREATININE: 0.68 mg/dL (ref 0.44–1.00)
Calcium: 8.8 mg/dL — ABNORMAL LOW (ref 8.9–10.3)
GFR calc non Af Amer: >60 mL/min (ref >60)
Glucose, Bld: 76 mg/dL (ref 65–99)
Potassium: 4.5 mmol/L (ref 3.5–5.1)
Sodium: 137 mmol/L (ref 135–145)

## 2017-03-08 LAB — URINE CULTURE: CULTURE: NO GROWTH

## 2017-03-08 LAB — MAGNESIUM: Magnesium: 1.5 mg/dL — ABNORMAL LOW (ref 1.7–2.4)

## 2017-03-08 LAB — GLUCOSE, CAPILLARY: GLUCOSE-CAPILLARY: 73 mg/dL (ref 65–99)

## 2017-03-08 MED ORDER — GLYCOPYRROLATE 0.2 MG/ML IJ SOLN
0.4000 mg | Freq: Three times a day (TID) | INTRAMUSCULAR | Status: DC
  Administered 2017-03-08: 0.4 mg via INTRAVENOUS
  Filled 2017-03-08: qty 2

## 2017-03-08 MED ORDER — LORAZEPAM 2 MG/ML IJ SOLN
1.0000 mg | INTRAMUSCULAR | Status: DC | PRN
  Administered 2017-03-08: 1 mg via INTRAVENOUS
  Filled 2017-03-08: qty 1

## 2017-03-08 MED ORDER — MAGNESIUM SULFATE 2 GM/50ML IV SOLN
2.0000 g | Freq: Once | INTRAVENOUS | Status: DC
  Filled 2017-03-08: qty 50

## 2017-03-08 NOTE — Consult Note (Signed)
[facility] Liaison  Received request from CSW [facility]. Chart reviewed and completed paper work with daughter for transfer today. CSW aware.  Please fax discharge summary to [phone].  RN please call report to [phone].  Thank you,  Forrestine Himva Davis, LCSW [phone]

## 2017-03-08 NOTE — Consult Note (Signed)
Consultation Note Date: 03/08/2017   Patient Name: Janet Brooks  DOB: 1921-06-11  MRN: [id number]  Age / Sex: 83 y.o., female  [facility]P: [facility] Referring Physician: Albertine Grates, MD  Reason for Consultation: Establishing goals of care and Psychosocial/spiritual support  HPI/Patient Profile: 83 y.o. female  admitted on 03/06/2017 with a past  medical history significant of spinal stenosis, HTN, glaucoma, asthma, insomnia who presents for mental status changes.    Family reports that the patient has had a significant  decline in memory and ability to care for herself in the last year.    The family became unable to care for her at home and placed her in an ALF/ [facility] in February.  Over the last month, she has had a worsening with hallucinations, paranoia and delusions.  Had significant decreased oral intake with weight loss.  Family reports she has been having bilateral eye infections for the last couple of days, is getting drops only for glaucoma.    In ER  CT head with chronic atrophy and no acute issue.  Her daughter who is healthcare power of attorney tells me that patient has a living will and desire for a natural death documented.  Family sees her overall failure to thrive and desire today is to shift focus of care to comfort quality and dignity.      Clinical Assessment and Goals of Care:  This NP Lorinda Creed reviewed medical records, received report from team, assessed the patient and then meet at the patient's bedside along with her daughter and SIL , and her granddaughter/Janet Brooks,  to discuss diagnosis, prognosis, GOC, EOL wishes disposition and options.  A detailed discussion was had today regarding advanced directives.  Concepts specific to code status, artifical feeding and hydration, continued IV antibiotics and rehospitalization was had.  The difference between a aggressive  medical intervention path  and a palliative comfort care path for this patient at this time was had.  Values and goals of care important to patient and family were attempted to be elicited.  MOST form introduced  Concept of Hospice and Palliative Care were discussed  Natural trajectory and expectations at EOL were discussed.  Questions and concerns addressed.   Family encouraged to call with questions or concerns.  PMT will continue to support holistically.   HCPOA/daughter- stated by and reinforced by family members who are present today    SUMMARY OF RECOMMENDATIONS    Code Status/Advance Care Planning:  DNR   Symptom Management:   Pain: Toradol as previously written  Terminal secretions: Rubinol 4 mg tid  Agitation: Ativan 1 mg IV every 4 hrs prn  Palliative Prophylaxis:   Aspiration, Bowel Regimen, Delirium Protocol, Frequent Pain Assessment and Oral Care  Additional Recommendations (Limitations, Scope, Preferences):  Full Comfort Care   Liquid diet as tolerated with known risk of aspiration  Psycho-social/Spiritual:   Desire for further Chaplaincy support:no-- patient is [place] and family will notify [place]   Additional Recommendations: Education on [employer]  Prognosis:   < 2 weeks  Shift to full comfort, no lab corrections or antibiotics use.  No artifical hydration.  High risk of aspiration, already with thick green sputum.   Discharge Planning: Hospice facility      Primary Diagnoses: Present on Admission: . Dehydration . Spinal stenosis . Hyponatremia . Atypical chest pain   I have reviewed the medical record, interviewed the patient and family, and examined the patient. The following aspects are pertinent.  Past Medical History:  Diagnosis Date  . Asthmatic bronchitis    "allergy bronchitis"  . Glaucoma   . Hypertension   . Spinal stenosis    Social History   Social History  . Marital status: Widowed     Spouse name: N/A  . Number of children: 3  . Years of education: 3712   Social History Main Topics  . Smoking status: Never Smoker  . Smokeless tobacco: Never Used  . Alcohol use No  . Drug use: No  . Sexual activity: Not Asked   Other Topics Concern  . None   Social History Narrative   Patient is widowed with 3 children.   Patient has hs education.   Patient drinks 1 cup daily.   Patient is right handed.   Family History  Problem Relation Age of Onset  . Stroke Mother    Scheduled Meds: . bisacodyl  10 mg Rectal Daily  . cyanocobalamin  1,000 mcg Intramuscular Daily  . dorzolamide  1 drop Both Eyes Q12H  . enoxaparin (LOVENOX) injection  30 mg Subcutaneous Q24H  . erythromycin   Both Eyes Q6H  . fluticasone  1 spray Each Nare BID  . metoprolol tartrate  2.5 mg Intravenous Q8H  . sodium chloride flush  3 mL Intravenous Q12H  . timolol  1 drop Both Eyes Q12H   Continuous Infusions: . sodium chloride 75 mL/hr at 03/07/17 1641  . famotidine (PEPCID) IV Stopped (03/08/17 0044)  . magnesium sulfate 1 - 4 g bolus IVPB     PRN Meds:.albuterol, diphenhydrAMINE, hydrALAZINE, ipratropium, ketorolac, levalbuterol, LORazepam, ondansetron (ZOFRAN) IV Medications Prior to Admission:  Prior to Admission medications   Medication Sig Start Date End Date Taking? Authorizing Provider  acetaminophen (TYLENOL) 500 MG tablet Take 500 mg by mouth 3 (three) times daily.   Yes [provider]  albuterol (PROVENTIL HFA;VENTOLIN HFA) 108 (90 Base) MCG/ACT inhaler Inhale 2 puffs into the lungs every 6 (six) hours as needed for wheezing or shortness of breath.   Yes [provider]  ALPRAZolam (XANAX) 0.25 MG tablet Take 1 tablet (0.25 mg total) by mouth 2 (two) times daily. Patient taking differently: Take 0.25 mg by mouth 2 (two) times daily as needed for anxiety.  03/27/15  Yes Micki RileySethi, Pramod S, MD  cholecalciferol (VITAMIN D) 1000 units tablet Take 1,000 Units by mouth daily.    Yes [provider]  docusate sodium (COLACE) 100 MG capsule Take 100 mg by mouth daily.   Yes [provider]  dorzolamide (TRUSOPT) 2 % ophthalmic solution Place 1 drop into both eyes every 12 (twelve) hours.     Yes [provider]  fluticasone (FLONASE) 50 MCG/ACT nasal spray Place 1 spray into both nostrils 2 (two) times daily.   Yes [provider]  gabapentin (NEURONTIN) 100 MG capsule Take 100 mg by mouth daily.    Yes [provider]  lisinopril (PRINIVIL,ZESTRIL) 20 MG tablet Take 20 mg by mouth daily. 05/31/14  Yes [provider]  loratadine (CLARITIN) 10 MG tablet Take 10 mg by mouth daily.   Yes [provider]  ranitidine (ZANTAC) 150 MG tablet Take 150 mg by mouth at bedtime.   Yes [provider]  risperiDONE (RISPERDAL) 0.25 MG tablet Take 0.25 mg by mouth 2 (two) times daily.   Yes [provider]  sertraline (ZOLOFT) 100 MG tablet Take 100 mg by mouth daily. Take with 25 mg to equal 125 mg   Yes [provider]  sertraline (ZOLOFT) 25 MG tablet Take 25 mg by mouth daily. Take with 100 mg to equal 125 mg 11/01/14  Yes [provider]  sulfamethoxazole-trimethoprim (BACTRIM DS,SEPTRA DS) 800-160 MG tablet Take 1 tablet by mouth 2 (two) times daily.   Yes [provider]  timolol (BETIMOL) 0.5 % ophthalmic solution Place 1 drop into both eyes every 12 (twelve) hours.     Yes [provider]  zolpidem (AMBIEN) 5 MG tablet Take 2.5 mg by mouth at bedtime.   Yes [provider]  guaifenesin (ROBITUSSIN) 100 MG/5ML syrup Take 10 mLs (200 mg total) by mouth 4 (four) times daily. Patient not taking: Reported on 03/06/2017 10/25/14   Richarda Overlie, MD  ipratropium-albuterol (DUONEB) 0.5-2.5 (3) MG/3ML SOLN Take 3 mLs by nebulization every 4 (four) hours as needed. Patient not taking: Reported on 03/06/2017 10/25/14   Richarda Overlie, MD  pantoprazole (PROTONIX) 40 MG tablet  Take 1 tablet (40 mg total) by mouth 2 (two) times daily. Patient not taking: Reported on 03/06/2017 10/25/14   Richarda Overlie, MD   Allergies  Allergen Reactions  . Codeine Itching and Rash  . Ultram [Tramadol Hcl] Rash   Review of Systems  Unable to perform ROS: Mental status change    Physical Exam  Constitutional: She appears lethargic. She appears cachectic. She appears ill. Nasal cannula in place.  HENT:  Mouth/Throat: Oropharynx is clear and moist.  Cardiovascular: Normal rate, regular rhythm and normal heart sounds.   Pulmonary/Chest: Breath sounds normal.  - cough for large amount of thick green sputum  Neurological: She appears lethargic.    Vital Signs: BP (!) 163/96 (BP Location: Right Arm)   Pulse 79   Temp 97.7 F (36.5 C) (Oral)   Resp 20   Ht 4\' 11"  (1.499 m)   Wt 45.4 kg (100 lb)   SpO2 98%   BMI 20.20 kg/m  Pain Assessment: No/denies pain   Pain Score: 0-No pain   SpO2: SpO2: 98 % O2 Device:SpO2: 98 % O2 Flow Rate: .   IO: Intake/output summary:  Intake/Output Summary (Last 24 hours) at 03/08/17 7829 Last data filed at 03/07/17 1641  Gross per 24 hour  Intake                0 ml  Output              800 ml  Net             -800 ml    LBM: Last BM Date: 03/07/17 (PTA) Baseline Weight: Weight: 45.4 kg (100 lb) Most recent weight: Weight: 45.4 kg (100 lb)     Palliative Assessment/Data: 30 % at best   Discussed with Dr Roda Shutters  Time In: 0915 Time Out: 1030 Time Total: 75 min Greater than 50%  of this time was spent counseling and coordinating care related to the above assessment and plan.  Signed by: Lorinda Creed, NP   Please contact Palliative Medicine Team phone at [phone] for questions  and concerns.  For individual provider: See Shea Evans

## 2017-03-08 NOTE — Clinical Social Work Placement (Signed)
   CLINICAL SOCIAL WORK PLACEMENT  NOTE  Date:  03/08/2017  Patient Details  Name: Janet CravenLucille C Coppin MRN: [id number] Date of Birth: [birth year]  Clinical Social Work is seeking post-discharge placement for this patient at the [name]) level of care (*CSW will initial, date and re-position this form in  chart as items are completed):  Yes   Patient/family provided with [facility] Clinical Social Work Department's list of facilities offering this level of care within the geographic area requested by the patient (or if unable, by the patient's family).  Yes   Patient/family informed of their freedom to choose among providers that offer the needed level of care, that participate in Medicare, Medicaid or managed care program needed by the patient, have an available bed and are willing to accept the patient.  Yes   Patient/family informed of [facility]'s ownership interest in [facility] and [facility], as well as of the fact that they are under no obligation to receive care at these facilities.  PASRR submitted to EDS on       PASRR number received on       Existing PASRR number confirmed on       FL2 transmitted to all facilities in geographic area requested by pt/family on       FL2 transmitted to all facilities within larger geographic area on       Patient informed that his/her managed care company has contracts with or will negotiate with certain facilities, including the following:            Patient/family informed of bed offers received.  Patient chooses bed at  [facility])     Physician recommends and patient chooses bed at      Patient to be transferred to  [facility]) on 03/08/17.  Patient to be transferred to facility by  [facility])     Patient family notified on 03/08/17 of transfer.  Name of family member notified:  Dtr @ bedside     PHYSICIAN Please sign DNR      Additional Comment:    _______________________________________________ Norlene DuelBROWN, Barnaby Rippeon B, LCSWA 03/08/2017, 1:58 PM

## 2017-03-08 NOTE — Clinical Social Work Note (Signed)
Clinical Social Worker facilitated patient discharge including contacting patient family (@ bedside) and facility [facility]) to confirm patient discharge plans. Clinical information faxed to facility and family agreeable with plan.  CSW [facility]. RN to call report to [phone] prior to discharge.  Clinical Social Worker will sign off for now as social work intervention is no longer needed. Please consult us again if new need arises.  Natina Wiginton B. Gean QuintBrown,MSW, LCSWA Clinical Social Work Dept Weekend Social Worker [phone] 1:55 PM

## 2017-03-08 NOTE — Discharge Summary (Signed)
Discharge Summary  Janet Brooks KZS:[id number] DOB: [birth year]  PCP: Janet Brooks, Janet Brooks  Admit date: 03/06/2017 Discharge date: 03/08/2017  Time spent: >37mns, more than 50% time spent on coordination of care, case discussed with Rn, palliative care, hospice representative and family  Recommendations for Outpatient Follow-up:  1. Discharge to residential hospice  Discharge Diagnoses:  Active Hospital Problems   Diagnosis Date Noted  . Adult failure to thrive   . Palliative care by specialist   . DNR (do not resuscitate)   . AKI (acute kidney injury) ([facility]   . Confusion   . Elevated lactic acid level   . Progressive dementia with uncertain etiology   . Dehydration 03/06/2017  . Spinal stenosis 10/20/2014  . Hyponatremia 10/20/2014  . Atypical chest pain 10/20/2014    Resolved Hospital Problems   Diagnosis Date Noted Date Resolved  No resolved problems to display.    Discharge Condition: stable  Diet recommendation: comfort feeds  Filed Weights   03/06/17 2017 03/07/17 1149  Weight: 45.4 kg (100 lb) 45.4 kg (100 lb)    History of present illness:  PCP: Janet Brooks, Janet Brooks  Patient coming from: ALF, Janet Brooks Chief Complaint: Decreased PO intake.   HPI: Janet Brooks a 83y.o. female with medical history significant of spinal stenosis, HTN, glaucoma, asthma, insomnia who presents for mental status changes.  Family is present (daughter, granddaughter, son in law) who tell most of the history.  Patient was not responsive to voice, but did speak to her granddaughter and responded to the blood pressure cuff.  Family reports that the patient has had a slow decline in memory and ability to care for herself in the last year.  The family became unable to care for her at home and placed her in an ALF in February.  Over the last month, she has had a worsening with hallucinations, paranoia and delusions.  Over the last 2 weeks, this has escalated.  She is  having decreased PO intake and fluid intake because she has been concerned that she was being poisoned.  She is wandering and speaking to imaginary friends. Some of her hallucinations are violent including seeing the murder of a small child.  Family reports her personality has become aggressive and angry which is a big changed for her.  She has had decreased medication intake.  I reviewed her medication log from the nursing home which showed she has not taken gabapentin, lisinopril, zoloft, ambien for at least the last 7-10 days.  She was started on risperdal at the ALF and took it twice in the last 24 hours.  She further has xanax for anxiety/tremor and took it twice today.  The family reports that a UC at the nursing home was + for infection and she was started on Bactrim (appeared that she had taken at least one dose based on MAR), but UA here did not show signs of infection.  Finally, family reports she has been having bilateral eye infections for the last couple of days, is getting drops only for glaucoma.    ED Course: In the ED, she was found to have a K of 3.3, Cr was up from 0.75 to 1.02, Lactic acid was 2.55 --> 1.58 with fluids.  B12 low at 84.  ESR 23, TSH 1.9. CXR with no acute abnormality.  CT head with chronic atrophy and no acute issue.     Hospital Course:  Active Problems:   Atypical chest pain  Spinal stenosis   Hyponatremia   Dehydration   AKI (acute kidney injury) (Janet Brooks)   Confusion   Elevated lactic acid level   Progressive dementia with uncertain etiology   Adult failure to thrive   Palliative care by specialist   DNR (do not resuscitate)   FTT from progressive dementia,  Per family patient has gradual decline for the past two yrs, for the past several weeks she has significantly worsening of altered mental status with paranoid and hallucinations, she stopped eating and taking her meds for the last two weeks.  ua/cxr unremarkable, no diarrhea, no identifiable  infection, wbc wnl, blood culture no growth.  MRSa screening negative. ths wnl, rpr negative. Ct head no acute findings.  she is significantly dehydrated and severe malnourished (significant fat depletion and muscle wasting )underweight Body mass index is 20.2 kg/m.  She received  Hydration, mental status has improved, became more alert the next day of hospitalization.    family has met with palliative care and desires full comfort measures and agreed to proceed with residential hospice.  Lactic acidosis presented on admission, resolved with hydration  Hypokalemia/hypomagnesemia: : replace k / mag  b12 deficiency:b12 84, she received im b12 injection in the hospital.  HTN/tachycardia: simproved with  iv lopressor.  H/o asthmatic bronchitis: currently no wheezing, no hypoxia, cxr with stable mild copd changes, continue nebs prn for comfort    Code Status: DNR  Family Communication: patient and family at bedside  Disposition Plan: residential hospice placement   Consultants:  Palliative care  Hospice  Social worker  Procedures:  none  Antibiotics:  none   Discharge Exam: BP 94/73 (BP Location: Left Arm)   Pulse 95   Temp 98.7 F (37.1 C) (Oral)   Resp 18   Ht 4' 11"  (1.499 m)   Wt 45.4 kg (100 lb)   SpO2 95%   BMI 20.20 kg/m   General: Frail, cachectic, more awake, recognize her daughter, report she is at home Cardiovascular: rrr Respiratory: diminished, some mild scatter rhonchi  Discharge Instructions You were cared for by a hospitalist during your hospital stay. If you have any questions about your discharge medications or the care you received while you were in the hospital after you are discharged, you can call the unit and asked to speak with the hospitalist on call if the hospitalist that took care of you is not available. Once you are discharged, your primary care physician will handle any further medical issues. Please note that NO  REFILLS for any discharge medications will be authorized once you are discharged, as it is imperative that you return to your primary care physician (or establish a relationship with a primary care physician if you do not have one) for your aftercare needs so that they can reassess your need for medications and monitor your lab values.  Discharge Instructions    Diet general    Complete by:  As directed    Comfort feeds   Increase activity slowly    Complete by:  As directed      Allergies as of 03/08/2017      Reactions   Codeine Itching, Rash   Ultram [tramadol Hcl] Rash      Medication List    STOP taking these medications   ALPRAZolam 0.25 MG tablet Commonly known as:  XANAX   docusate sodium 100 MG capsule Commonly known as:  COLACE   gabapentin 100 MG capsule Commonly known as:  NEURONTIN   lisinopril 20  MG tablet Commonly known as:  PRINIVIL,ZESTRIL   loratadine 10 MG tablet Commonly known as:  CLARITIN   pantoprazole 40 MG tablet Commonly known as:  PROTONIX   ranitidine 150 MG tablet Commonly known as:  ZANTAC   risperiDONE 0.25 MG tablet Commonly known as:  RISPERDAL   sertraline 100 MG tablet Commonly known as:  ZOLOFT   sertraline 25 MG tablet Commonly known as:  ZOLOFT   sulfamethoxazole-trimethoprim 800-160 MG tablet Commonly known as:  BACTRIM DS,SEPTRA DS   zolpidem 5 MG tablet Commonly known as:  AMBIEN     TAKE these medications   acetaminophen 500 MG tablet Commonly known as:  TYLENOL Take 500 mg by mouth 3 (three) times daily.   albuterol 108 (90 Base) MCG/ACT inhaler Commonly known as:  PROVENTIL HFA;VENTOLIN HFA Inhale 2 puffs into the lungs every 6 (six) hours as needed for wheezing or shortness of breath.   cholecalciferol 1000 units tablet Commonly known as:  VITAMIN D Take 1,000 Units by mouth daily.   dorzolamide 2 % ophthalmic solution Commonly known as:  TRUSOPT Place 1 drop into both eyes every 12 (twelve) hours.     fluticasone 50 MCG/ACT nasal spray Commonly known as:  FLONASE Place 1 spray into both nostrils 2 (two) times daily.   guaifenesin 100 MG/5ML syrup Commonly known as:  ROBITUSSIN Take 10 mLs (200 mg total) by mouth 4 (four) times daily.   ipratropium-albuterol 0.5-2.5 (3) MG/3ML Soln Commonly known as:  DUONEB Take 3 mLs by nebulization every 4 (four) hours as needed.   timolol 0.5 % ophthalmic solution Commonly known as:  BETIMOL Place 1 drop into both eyes every 12 (twelve) hours.      Allergies  Allergen Reactions  . Codeine Itching and Rash  . Ultram [Tramadol Hcl] Rash      The results of significant diagnostics from this hospitalization (including imaging, microbiology, ancillary and laboratory) are listed below for reference.    Significant Diagnostic Studies: Ct Head Wo Contrast  Result Date: 03/06/2017 CLINICAL DATA:  Altered mental status, currently being treated for urinary tract infection. EXAM: CT HEAD WITHOUT CONTRAST TECHNIQUE: Contiguous axial images were obtained from the base of the skull through the vertex without intravenous contrast. COMPARISON:  03/30/2014 CT head FINDINGS: BRAIN: There is mild sulcal and ventricular prominence consistent with superficial and central atrophy. No intraparenchymal hemorrhage, mass effect nor midline shift. Periventricular and subcortical white matter hypodensities consistent with chronic small vessel ischemic disease are identified. No acute large vascular territory infarcts. No abnormal extra-axial fluid collections. Basal cisterns are not effaced and midline. VASCULAR: Moderate calcific atherosclerosis of the carotid siphons. SKULL: No skull fracture. No significant scalp soft tissue swelling. SINUSES/ORBITS: The mastoid air-cells are clear. The included paranasal sinuses are well-aerated.The included ocular globes and orbital contents are non-suspicious. OTHER: None. IMPRESSION: Chronic atrophy with periventricular small vessel  ischemic disease. No acute intracranial abnormality. Electronically Signed   By: Ashley Royalty M.D.   On: 03/06/2017 22:40   Dg Chest Port 1 View  Result Date: 03/08/2017 CLINICAL DATA:  Acute respiratory distress, history hypertension, asthmatic bronchitis, COPD EXAM: PORTABLE CHEST 1 VIEW COMPARISON:  Portable exam 0703 hours compared to 03/06/2017 FINDINGS: Enlargement of cardiac silhouette. Atherosclerotic calcification aorta. Mediastinal contours and pulmonary vascularity normal. Mild central peribronchial thickening. Lungs hyperexpanded but clear. No pulmonary infiltrate, pleural effusion or pneumothorax. Bones demineralized. IMPRESSION: Enlargement of cardiac silhouette. Probable COPD changes without infiltrate. Electronically Signed   By: Crist Infante.D.  On: 03/08/2017 07:42   Dg Chest Port 1 View  Result Date: 03/06/2017 CLINICAL DATA:  Altered mental status.  Weakness. EXAM: PORTABLE CHEST 1 VIEW COMPARISON:  10/20/2014. FINDINGS: Enlarged cardiac silhouette with an interval increase in size. Tortuous calcified thoracic aorta. Clear lungs. The lungs are mildly hyperexpanded with minimally prominent interstitial markings, without significant change. Diffuse osteopenia. IMPRESSION: No acute abnormality. Progressive cardiomegaly with stable mild changes of COPD. Electronically Signed   By: Claudie Revering M.D.   On: 03/06/2017 20:51    Microbiology: Recent Results (from the past 240 hour(s))  Blood Culture (routine x 2)     Status: None (Preliminary result)   Collection Time: 03/06/17  8:36 PM  Result Value Ref Range Status   Specimen Description BLOOD RIGHT WRIST  Final   Special Requests   Final    BOTTLES DRAWN AEROBIC AND ANAEROBIC Blood Culture adequate volume   Culture NO GROWTH 1 DAY  Final   Report Status PENDING  Incomplete  Urine culture     Status: None   Collection Time: 03/06/17  9:45 PM  Result Value Ref Range Status   Specimen Description URINE, RANDOM  Final   Special  Requests NONE  Final   Culture NO GROWTH  Final   Report Status 03/08/2017 FINAL  Final  Blood Culture (routine x 2)     Status: None (Preliminary result)   Collection Time: 03/06/17  9:48 PM  Result Value Ref Range Status   Specimen Description BLOOD LEFT HAND  Final   Special Requests IN PEDIATRIC BOTTLE Blood Culture adequate volume  Final   Culture NO GROWTH 1 DAY  Final   Report Status PENDING  Incomplete  MRSA PCR Screening     Status: None   Collection Time: 03/07/17  3:23 AM  Result Value Ref Range Status   MRSA by PCR NEGATIVE NEGATIVE Final    Comment:        The GeneXpert MRSA Assay (FDA approved for NASAL specimens only), is one component of a comprehensive MRSA colonization surveillance program. It is not intended to diagnose MRSA infection nor to guide or monitor treatment for MRSA infections.      Labs: Basic Metabolic Panel:  Recent Labs Lab 03/06/17 2018 03/07/17 0209 03/08/17 0500  NA 141 141 137  K 3.3* 3.5 4.5  CL 106 104 106  CO2 24 26 19*  GLUCOSE 118* 124* 76  BUN 20 11 9   CREATININE 1.02* 0.83 0.68  CALCIUM 9.6 9.3 8.8*  MG  --   --  1.5*   Liver Function Tests:  Recent Labs Lab 03/06/17 2018  AST 30  ALT 12*  ALKPHOS 66  BILITOT 0.9  PROT 7.5  ALBUMIN 3.9   No results for input(s): LIPASE, AMYLASE in the last 168 hours. No results for input(s): AMMONIA in the last 168 hours. CBC:  Recent Labs Lab 03/06/17 2018 03/07/17 0209  WBC 7.2 6.7  HGB 13.8 13.9  HCT 41.9 42.6  MCV 91.1 91.8  PLT 166 171   Cardiac Enzymes: No results for input(s): CKTOTAL, CKMB, CKMBINDEX, TROPONINI in the last 168 hours. BNP: BNP (last 3 results) No results for input(s): BNP in the last 8760 hours.  ProBNP (last 3 results) No results for input(s): PROBNP in the last 8760 hours.  CBG:  Recent Labs Lab 03/07/17 0902 03/08/17 1108  GLUCAP 112* 73       Signed:  Zuleica Seith MD, PhD  [facility] 03/08/2017, 2:52 PM

## 2017-03-08 NOTE — Clinical Social Work Note (Signed)
CSW received hospice consult, met with pt/family @ bedside. The family would like to go with comfort care. The family would like [facility]. Referral made to [facility].  CSW will continue to follow for DC planning.  Presten Joost B. Joline Maxcy Clinical Social Work Dept Weekend Social Worker [phone] 1:54 PM

## 2017-03-08 NOTE — Progress Notes (Signed)
Report given to [facility]

## 2017-03-08 NOTE — Progress Notes (Signed)
Pt. Discharged. IV's D/C.Family at bedside. [facility] here to transport. DNR paperwork completed. All patients belongs at bedside and with family.

## 2017-03-09 LAB — HOMOCYSTEINE: Homocysteine: 30.6 umol/L — ABNORMAL HIGH (ref 0.0–15.0)

## 2017-03-11 LAB — METHYLMALONIC ACID(MMA), RND URINE
CREATININE(CRT), U: 0.59 g/L (ref 0.30–3.00)
METHYLMALONIC ACID UR: 14.8 umol/L (ref 1.6–29.7)
MMA - NORMALIZED: 2.8 umol/mmol{creat} — AB (ref 0.4–2.5)

## 2017-03-12 LAB — CULTURE, BLOOD (ROUTINE X 2)
CULTURE: NO GROWTH
CULTURE: NO GROWTH
SPECIAL REQUESTS: ADEQUATE
Special Requests: ADEQUATE

## 2017-03-28 DEATH — deceased

## 2018-03-13 IMAGING — CT CT HEAD W/O CM
4 series · 17 of 47 positions shown, 19 images · non-contrast
Comparison: 03/30/2014 CT head

CLINICAL DATA: Altered mental status, currently being treated for
urinary tract infection.

EXAM:
CT HEAD WITHOUT CONTRAST
TECHNIQUE: Contiguous axial images were obtained from the base of the skull
through the vertex without intravenous contrast.

[Series 3: head wo · axial · 0.38mm/px · z∈[-124,-4]mm · 7 of 32 slices shown, 9 images]
[im 4/32  brain]
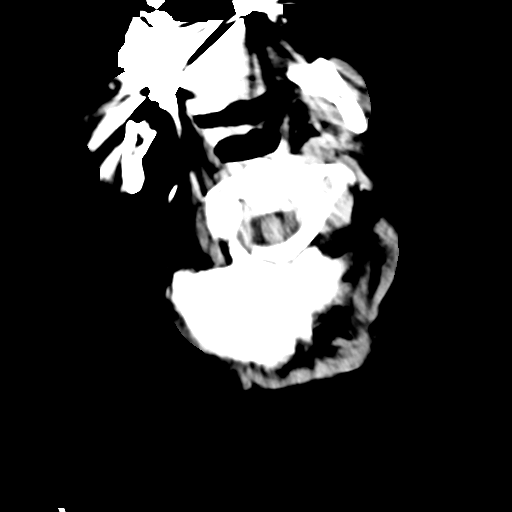
[im 4/32  bone]
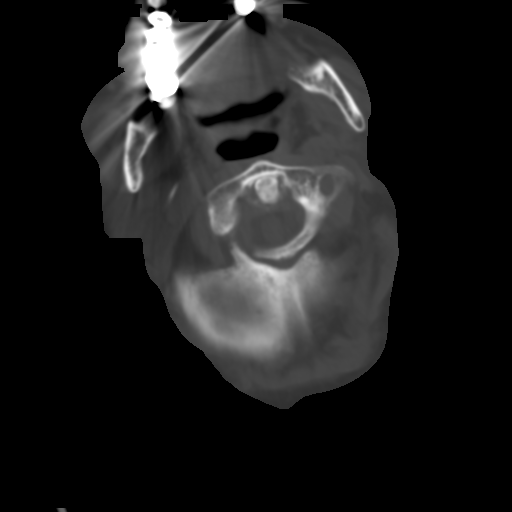
[im 8/32  brain]
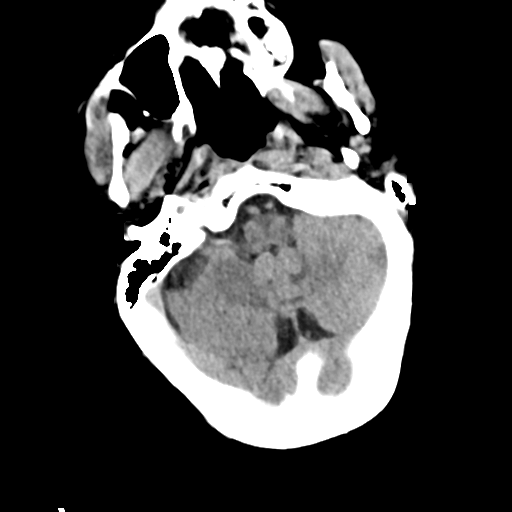
[im 12/32  brain]
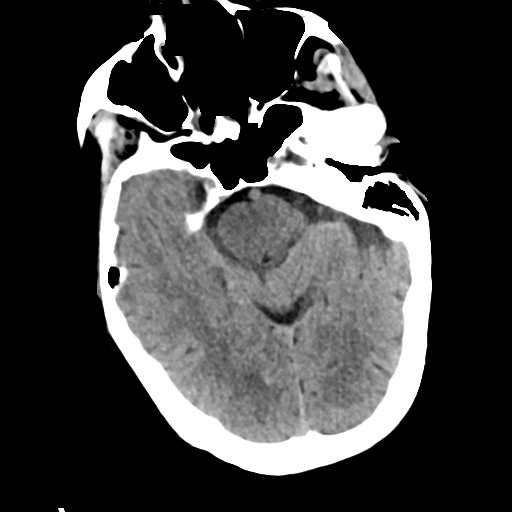
[im 16/32  brain]
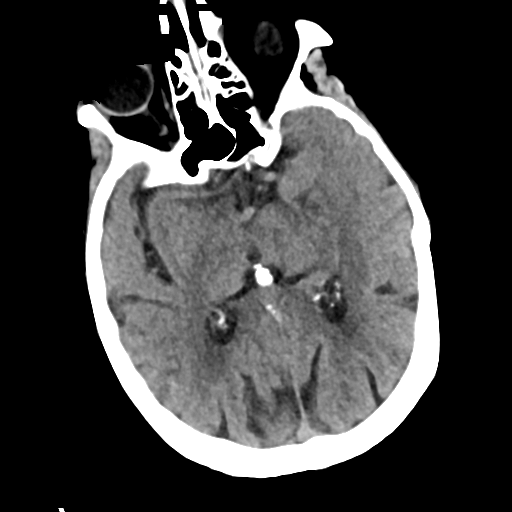
[im 20/32  brain]
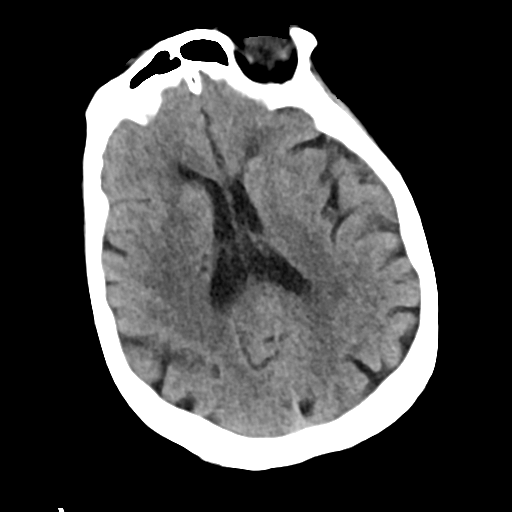
[im 20/32  bone]
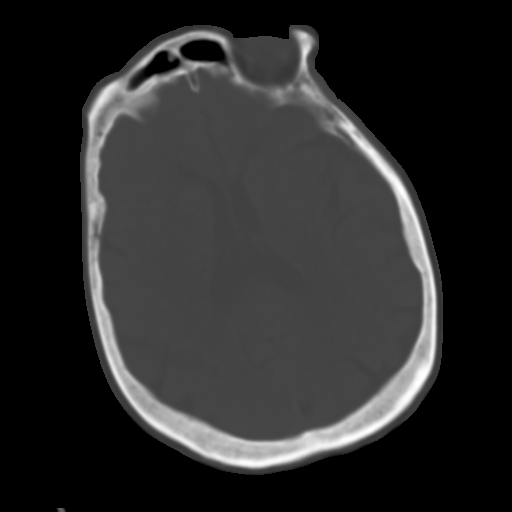
[im 24/32  brain]
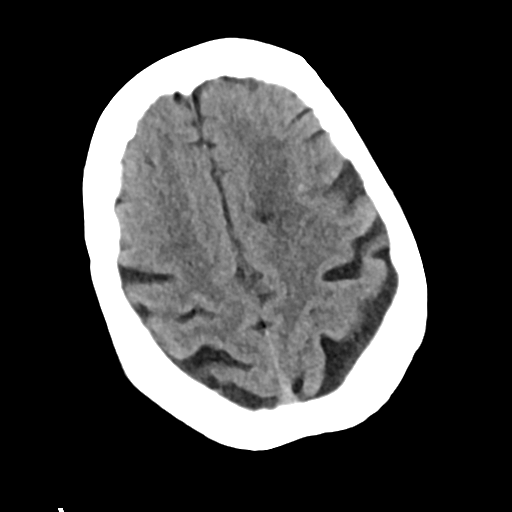
[im 28/32  brain]
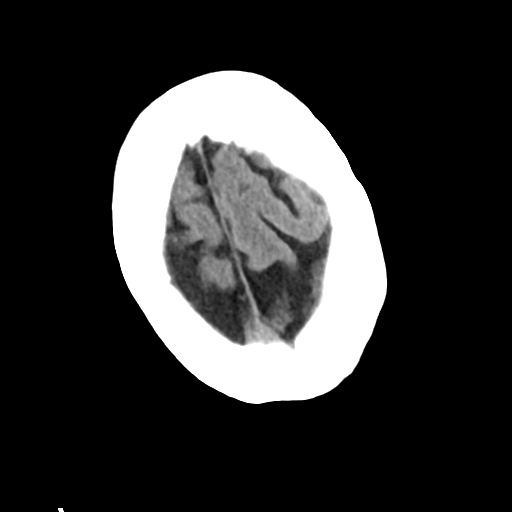

[Series 4: head bone · axial · 0.38mm/px · z∈[-124,-68]mm · 4 of 79 slices shown]
[im 8/79  bone]
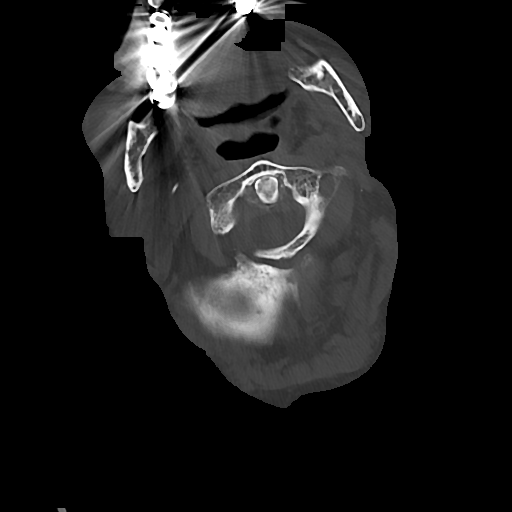
[im 16/79  bone]
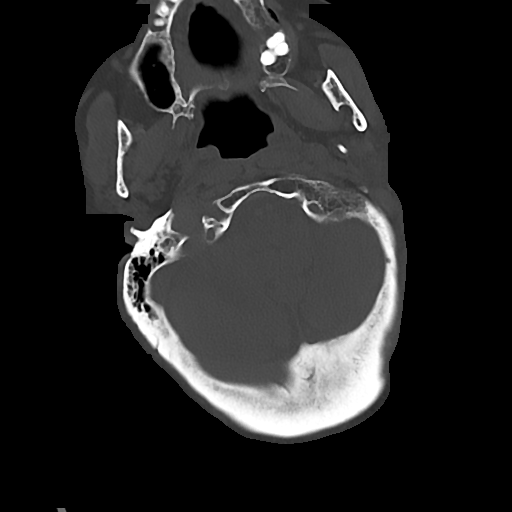
[im 24/79  bone]
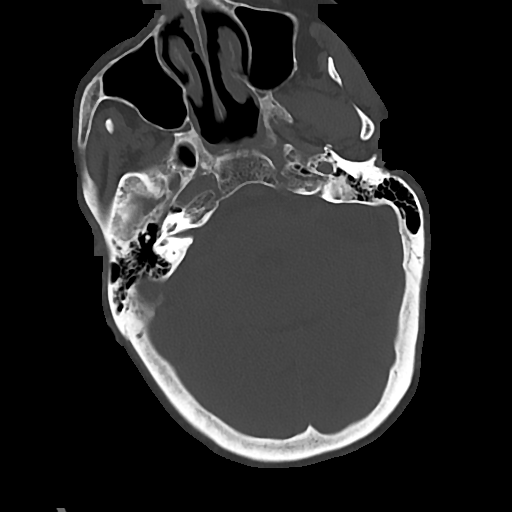
[im 36/79  bone]
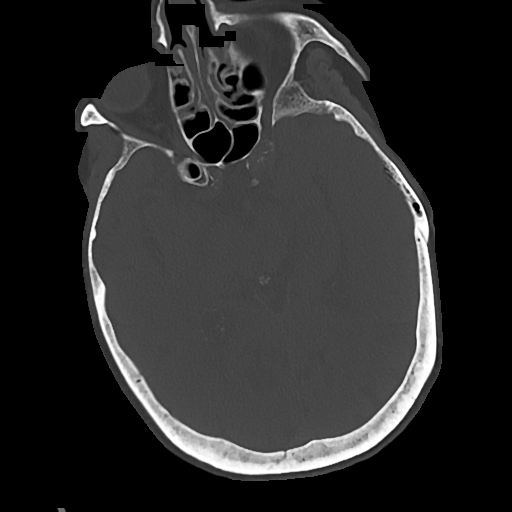

[Series 5: cor soft · coronal · 0.32mm/px · 3 of 65 slices shown]
[im 22/65  brain]
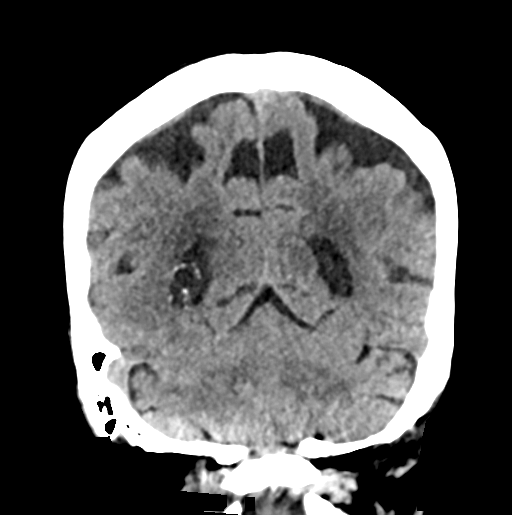
[im 29/65  brain]
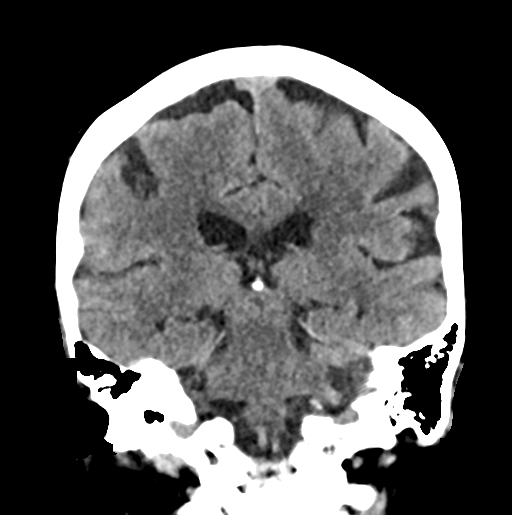
[im 36/65  brain]
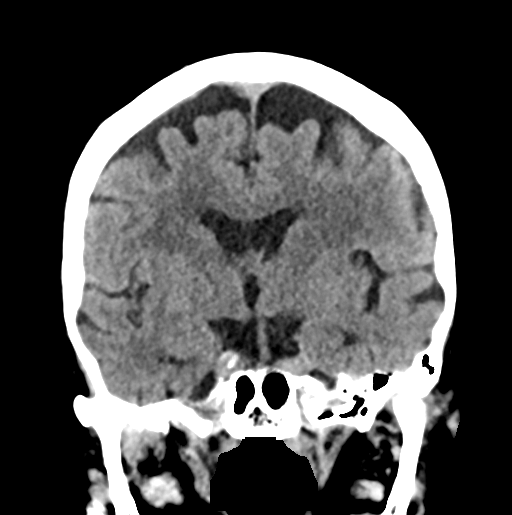

[Series 6: sag soft · sagittal · 0.32mm/px · 3 of 52 slices shown]
[im 20/52  brain]
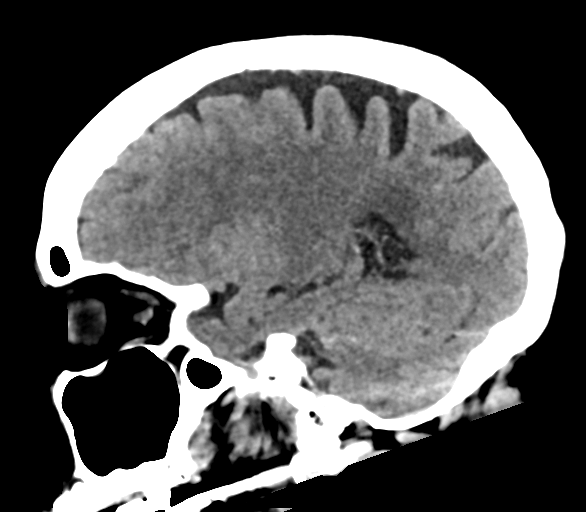
[im 26/52  brain]
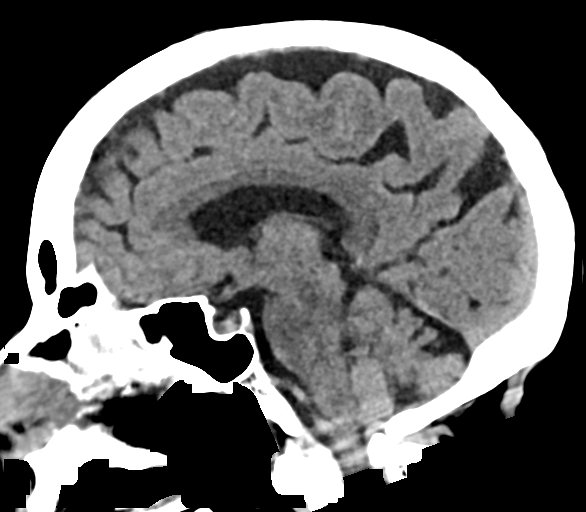
[im 33/52  brain]
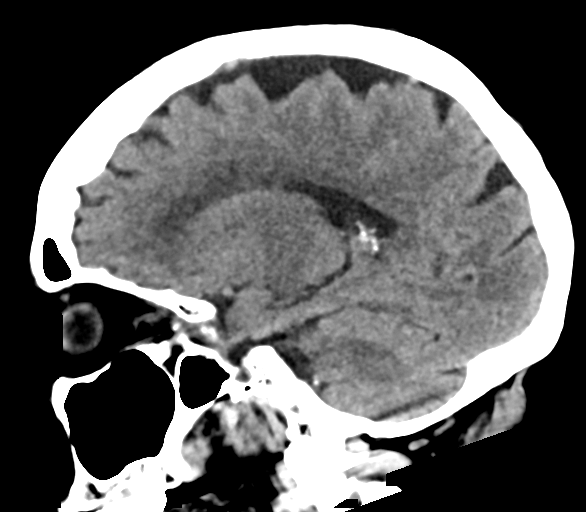

[17 of 47 positions shown; findings below may reference images not displayed]

FINDINGS: BRAIN: There is mild sulcal and ventricular prominence consistent
with superficial and central atrophy. No intraparenchymal
hemorrhage, mass effect nor midline shift. Periventricular and
subcortical white matter hypodensities consistent with chronic small
vessel ischemic disease are identified. No acute large vascular
territory infarcts. No abnormal extra-axial fluid collections. Basal
cisterns are not effaced and midline.

VASCULAR: Moderate calcific atherosclerosis of the carotid siphons.

SKULL: No skull fracture. No significant scalp soft tissue swelling.

SINUSES/ORBITS: The mastoid air-cells are clear. The included
paranasal sinuses are well-aerated.The included ocular globes and
orbital contents are non-suspicious.

OTHER: None.
IMPRESSION: Chronic atrophy with periventricular small vessel ischemic disease.
No acute intracranial abnormality.
